# Patient Record
Sex: Female | Born: 1965 | Hispanic: No | Marital: Married | Smoking: Never smoker
Health system: Southern US, Community
[De-identification: ages and names within clinical notes are randomized; demographics above are authoritative.]

## PROBLEM LIST (undated history)

## (undated) DIAGNOSIS — D649 Anemia, unspecified: Secondary | ICD-10-CM

## (undated) DIAGNOSIS — I10 Essential (primary) hypertension: Secondary | ICD-10-CM

## (undated) DIAGNOSIS — K219 Gastro-esophageal reflux disease without esophagitis: Secondary | ICD-10-CM

## (undated) DIAGNOSIS — E785 Hyperlipidemia, unspecified: Secondary | ICD-10-CM

## (undated) HISTORY — PX: ABDOMINAL HYSTERECTOMY: SHX81

## (undated) HISTORY — DX: Hyperlipidemia, unspecified: E78.5

## (undated) HISTORY — PX: ABDOMINAL SURGERY: SHX537

## (undated) HISTORY — DX: Anemia, unspecified: D64.9

## (undated) HISTORY — DX: Gastro-esophageal reflux disease without esophagitis: K21.9

---

## 1998-01-17 ENCOUNTER — Emergency Department (HOSPITAL_COMMUNITY): Admission: EM | Admit: 1998-01-17 | Discharge: 1998-01-17 | Payer: Self-pay | Admitting: Emergency Medicine

## 1998-01-17 ENCOUNTER — Encounter: Payer: Self-pay | Admitting: Emergency Medicine

## 1999-08-24 ENCOUNTER — Ambulatory Visit (HOSPITAL_COMMUNITY): Admission: RE | Admit: 1999-08-24 | Discharge: 1999-08-24 | Payer: Self-pay | Admitting: Gynecology

## 1999-08-24 ENCOUNTER — Encounter: Payer: Self-pay | Admitting: Gynecology

## 2000-08-01 ENCOUNTER — Other Ambulatory Visit: Admission: RE | Admit: 2000-08-01 | Discharge: 2000-08-01 | Payer: Self-pay | Admitting: Gynecology

## 2000-08-01 ENCOUNTER — Encounter (INDEPENDENT_AMBULATORY_CARE_PROVIDER_SITE_OTHER): Payer: Self-pay

## 2000-10-04 ENCOUNTER — Inpatient Hospital Stay (HOSPITAL_COMMUNITY): Admission: RE | Admit: 2000-10-04 | Discharge: 2000-10-06 | Payer: Self-pay | Admitting: Gynecology

## 2000-10-04 ENCOUNTER — Encounter (INDEPENDENT_AMBULATORY_CARE_PROVIDER_SITE_OTHER): Payer: Self-pay | Admitting: *Deleted

## 2001-10-09 ENCOUNTER — Other Ambulatory Visit: Admission: RE | Admit: 2001-10-09 | Discharge: 2001-10-09 | Payer: Self-pay | Admitting: Gynecology

## 2003-03-29 ENCOUNTER — Emergency Department (HOSPITAL_COMMUNITY): Admission: EM | Admit: 2003-03-29 | Discharge: 2003-03-29 | Payer: Self-pay | Admitting: *Deleted

## 2003-10-01 ENCOUNTER — Ambulatory Visit (HOSPITAL_COMMUNITY): Admission: RE | Admit: 2003-10-01 | Discharge: 2003-10-01 | Payer: Self-pay

## 2003-12-30 ENCOUNTER — Emergency Department (HOSPITAL_COMMUNITY): Admission: EM | Admit: 2003-12-30 | Discharge: 2003-12-30 | Payer: Self-pay | Admitting: Family Medicine

## 2004-02-10 ENCOUNTER — Encounter: Admission: RE | Admit: 2004-02-10 | Discharge: 2004-05-10 | Payer: Self-pay | Admitting: Occupational Medicine

## 2004-05-06 ENCOUNTER — Ambulatory Visit (HOSPITAL_COMMUNITY): Admission: RE | Admit: 2004-05-06 | Discharge: 2004-05-06 | Payer: Self-pay | Admitting: Orthopedic Surgery

## 2004-12-07 ENCOUNTER — Other Ambulatory Visit: Admission: RE | Admit: 2004-12-07 | Discharge: 2004-12-07 | Payer: Self-pay | Admitting: Gynecology

## 2005-12-11 ENCOUNTER — Other Ambulatory Visit: Admission: RE | Admit: 2005-12-11 | Discharge: 2005-12-11 | Payer: Self-pay | Admitting: Gynecology

## 2006-06-06 ENCOUNTER — Ambulatory Visit: Payer: Self-pay | Admitting: Emergency Medicine

## 2006-06-21 ENCOUNTER — Ambulatory Visit: Payer: Self-pay | Admitting: Emergency Medicine

## 2006-07-19 ENCOUNTER — Ambulatory Visit: Payer: Self-pay | Admitting: Emergency Medicine

## 2007-02-04 DIAGNOSIS — E785 Hyperlipidemia, unspecified: Secondary | ICD-10-CM | POA: Insufficient documentation

## 2007-02-04 DIAGNOSIS — R05 Cough: Secondary | ICD-10-CM

## 2007-02-04 DIAGNOSIS — R059 Cough, unspecified: Secondary | ICD-10-CM | POA: Insufficient documentation

## 2007-02-04 DIAGNOSIS — J309 Allergic rhinitis, unspecified: Secondary | ICD-10-CM | POA: Insufficient documentation

## 2007-02-04 DIAGNOSIS — K219 Gastro-esophageal reflux disease without esophagitis: Secondary | ICD-10-CM | POA: Insufficient documentation

## 2007-02-20 ENCOUNTER — Other Ambulatory Visit: Admission: RE | Admit: 2007-02-20 | Discharge: 2007-02-20 | Payer: Self-pay | Admitting: Gynecology

## 2007-12-18 ENCOUNTER — Ambulatory Visit: Payer: Self-pay | Admitting: Emergency Medicine

## 2008-02-23 ENCOUNTER — Ambulatory Visit: Payer: Self-pay | Admitting: Gynecology

## 2008-02-23 ENCOUNTER — Other Ambulatory Visit: Admission: RE | Admit: 2008-02-23 | Discharge: 2008-02-23 | Payer: Self-pay | Admitting: Gynecology

## 2008-02-23 ENCOUNTER — Encounter: Payer: Self-pay | Admitting: Gynecology

## 2008-03-01 ENCOUNTER — Ambulatory Visit: Payer: Self-pay | Admitting: Gynecology

## 2008-05-03 ENCOUNTER — Ambulatory Visit: Payer: Self-pay | Admitting: Gynecology

## 2009-02-28 ENCOUNTER — Ambulatory Visit: Payer: Self-pay | Admitting: Gynecology

## 2009-02-28 ENCOUNTER — Other Ambulatory Visit: Admission: RE | Admit: 2009-02-28 | Discharge: 2009-02-28 | Payer: Self-pay | Admitting: Gynecology

## 2010-03-01 ENCOUNTER — Other Ambulatory Visit: Admission: RE | Admit: 2010-03-01 | Discharge: 2010-03-01 | Payer: Self-pay | Admitting: Gynecology

## 2010-03-01 ENCOUNTER — Ambulatory Visit: Payer: Self-pay | Admitting: Gynecology

## 2010-05-09 NOTE — Assessment & Plan Note (Signed)
Summary: cough   Visit Type:  Follow-up PCP:  Donette Larry   Chief Complaint:  Cough.  History of Present Illness: Ms. D'Jimraou is a very pleasant 45 year old woman, who returns today for her chronic cough. Mild restriction, no definite obstruction 06/21/06. Not on BD's. Not on standing rx for PND or GERD - uses as needed. No cough currently. Occas has paroxysms of cough with exposure to perfume.  2 weeks ago had an episode of paroxysmal cough, had to leave work. Wants to get FMLA paperwork.      Prior Medications Reviewed Using: Patient Recall  Updated Prior Medication List: CRESTOR 10 MG TABS (ROSUVASTATIN CALCIUM) Take 1 tablet by mouth once a day NASACORT AQ 55 MCG/ACT  AERS (TRIAMCINOLONE ACETONIDE(NASAL)) Take 2 sprays in each nostril once a day as needed LORATADINE 10 MG  TABS (LORATADINE) Take 1 tablet by mouth once a day as needed PROTONIX 40 MG  TBEC (PANTOPRAZOLE SODIUM) Take 1 tablet by mouth once a day as needed METFORMIN HCL 500 MG XR24H-TAB (METFORMIN HCL) Take 1 tablet by mouth once a day  Current Allergies (reviewed today): ! PENICILLIN ! SULFA ! ASA  Past Surgical History:    fibroids removed- 1994   Family History:    No significant FH  Social History:    Patient never smoked.     Pt is married, no children.    Pt works at [employer] in [employer].   Risk Factors:  Tobacco use:  never    Vital Signs:  Patient Profile:   45 Years Old Female Weight:      178.13 pounds O2 Sat:      98 % O2 treatment:    Room Air Temp:     98.2 degrees F oral Pulse rate:   68 / minute BP sitting:   130 / 84  (right arm)  Vitals Entered By: Cloyde Reams RN (December 18, 2007 10:13 AM)             Comments Pt is here today for a follow-up visit.  Requesting FMLA papers to be completed. Pt states breathing well overall, except when exposed to heavy perfumes and odors.  Occ dry cough at times, but minimal.  Medications reviewed Cloyde Reams RN  December 18, 2007  10:14 AM      Physical Exam  General:     normal appearance and healthy appearing.   Head:     normocephalic and atraumatic Nose:     no deformity, discharge, inflammation, or lesions Mouth:     no deformity or lesions Neck:     no masses, thyromegaly, or abnormal cervical nodes Lungs:     clear bilaterally to auscultation and percussion Heart:     regular rate and rhythm, S1, S2 without murmurs, rubs, gallops, or clicks Abdomen:     not examined Msk:     no deformity or scoliosis noted with normal posture Extremities:     no clubbing, cyanosis, edema, or deformity noted Neurologic:     non-focal Skin:     intact without lesions or rashes Psych:     alert and cooperative; normal mood and affect; normal attention span and concentration      Impression & Recommendations:  Problem # 1:  COUGH, CHRONIC (ICD-786.2) No definitive evidence for asthma - will need methacholine challenge to further eval. She would like to defer at this time. Will use allergy regimen and GERD regimen as ordered if cough reccurs. No FMLA paperwork at this time.  Orders: Est. Patient Level IV (16109)   Medications Added to Medication List This Visit: 1)  Crestor 10 Mg Tabs (Rosuvastatin calcium) .... Take 1 tablet by mouth once a day 2)  Nasacort Aq 55 Mcg/act Aers (Triamcinolone acetonide(nasal)) .... Take 2 sprays in each nostril once a day as needed 3)  Loratadine 10 Mg Tabs (Loratadine) .... Take 1 tablet by mouth once a day as needed 4)  Protonix 40 Mg Tbec (Pantoprazole sodium) .... Take 1 tablet by mouth once a day as needed 5)  Metformin Hcl 500 Mg Xr24h-tab (Metformin hcl) .... Take 1 tablet by mouth once a day   Patient Instructions: 1)  We will not perform any testing at this time. 2)  Please call Dr Delton Coombes if you have any problems or if you decide to have further pulmonary testing performed.   ]

## 2010-06-15 ENCOUNTER — Emergency Department (HOSPITAL_COMMUNITY): Payer: No Typology Code available for payment source

## 2010-06-15 ENCOUNTER — Emergency Department (HOSPITAL_COMMUNITY)
Admission: EM | Admit: 2010-06-15 | Discharge: 2010-06-15 | Disposition: A | Discharge status: Home / Self Care | Payer: No Typology Code available for payment source | Attending: Emergency Medicine | Admitting: Emergency Medicine

## 2010-06-15 DIAGNOSIS — J45909 Unspecified asthma, uncomplicated: Secondary | ICD-10-CM | POA: Insufficient documentation

## 2010-06-15 DIAGNOSIS — M545 Low back pain, unspecified: Secondary | ICD-10-CM | POA: Insufficient documentation

## 2010-06-15 DIAGNOSIS — E119 Type 2 diabetes mellitus without complications: Secondary | ICD-10-CM | POA: Insufficient documentation

## 2010-06-15 DIAGNOSIS — S20219A Contusion of unspecified front wall of thorax, initial encounter: Secondary | ICD-10-CM | POA: Insufficient documentation

## 2010-06-15 DIAGNOSIS — R079 Chest pain, unspecified: Secondary | ICD-10-CM | POA: Insufficient documentation

## 2010-06-15 DIAGNOSIS — S335XXA Sprain of ligaments of lumbar spine, initial encounter: Secondary | ICD-10-CM | POA: Insufficient documentation

## 2010-06-15 DIAGNOSIS — S8000XA Contusion of unspecified knee, initial encounter: Secondary | ICD-10-CM | POA: Insufficient documentation

## 2010-06-16 ENCOUNTER — Emergency Department (HOSPITAL_COMMUNITY): Payer: No Typology Code available for payment source

## 2010-06-16 ENCOUNTER — Emergency Department (HOSPITAL_COMMUNITY)
Admission: EM | Admit: 2010-06-16 | Discharge: 2010-06-16 | Disposition: A | Discharge status: Home / Self Care | Payer: No Typology Code available for payment source | Attending: Emergency Medicine | Admitting: Emergency Medicine

## 2010-06-16 DIAGNOSIS — M25519 Pain in unspecified shoulder: Secondary | ICD-10-CM | POA: Insufficient documentation

## 2010-06-16 DIAGNOSIS — R071 Chest pain on breathing: Secondary | ICD-10-CM | POA: Insufficient documentation

## 2010-06-16 DIAGNOSIS — R079 Chest pain, unspecified: Secondary | ICD-10-CM | POA: Insufficient documentation

## 2010-06-16 DIAGNOSIS — S139XXA Sprain of joints and ligaments of unspecified parts of neck, initial encounter: Secondary | ICD-10-CM | POA: Insufficient documentation

## 2010-06-16 DIAGNOSIS — Y92410 Unspecified street and highway as the place of occurrence of the external cause: Secondary | ICD-10-CM | POA: Insufficient documentation

## 2010-06-16 DIAGNOSIS — R209 Unspecified disturbances of skin sensation: Secondary | ICD-10-CM | POA: Insufficient documentation

## 2010-06-19 ENCOUNTER — Other Ambulatory Visit: Payer: Self-pay | Admitting: Internal Medicine

## 2010-06-19 ENCOUNTER — Ambulatory Visit
Admission: RE | Admit: 2010-06-19 | Discharge: 2010-06-19 | Disposition: A | Discharge status: Home / Self Care | Payer: Commercial Managed Care - PPO | Source: Ambulatory Visit | Attending: Internal Medicine | Admitting: Internal Medicine

## 2010-06-19 DIAGNOSIS — R202 Paresthesia of skin: Secondary | ICD-10-CM

## 2010-06-19 DIAGNOSIS — R4781 Slurred speech: Secondary | ICD-10-CM

## 2010-07-05 ENCOUNTER — Ambulatory Visit: Payer: 59 | Attending: Internal Medicine

## 2010-07-05 DIAGNOSIS — IMO0001 Reserved for inherently not codable concepts without codable children: Secondary | ICD-10-CM | POA: Insufficient documentation

## 2010-07-05 DIAGNOSIS — R4782 Fluency disorder in conditions classified elsewhere: Secondary | ICD-10-CM | POA: Insufficient documentation

## 2010-07-10 ENCOUNTER — Ambulatory Visit: Payer: 59 | Attending: Internal Medicine | Admitting: Speech Pathology

## 2010-07-10 DIAGNOSIS — IMO0001 Reserved for inherently not codable concepts without codable children: Secondary | ICD-10-CM | POA: Insufficient documentation

## 2010-07-10 DIAGNOSIS — R4782 Fluency disorder in conditions classified elsewhere: Secondary | ICD-10-CM | POA: Insufficient documentation

## 2010-07-13 ENCOUNTER — Ambulatory Visit: Payer: 59

## 2010-07-17 ENCOUNTER — Ambulatory Visit: Payer: 59 | Admitting: Speech Pathology

## 2010-07-19 ENCOUNTER — Ambulatory Visit: Payer: 59 | Admitting: Speech Pathology

## 2010-07-24 ENCOUNTER — Ambulatory Visit: Payer: 59

## 2010-07-24 ENCOUNTER — Encounter: Payer: 59 | Attending: Internal Medicine | Admitting: *Deleted

## 2010-07-24 DIAGNOSIS — Z713 Dietary counseling and surveillance: Secondary | ICD-10-CM | POA: Insufficient documentation

## 2010-07-24 DIAGNOSIS — R7309 Other abnormal glucose: Secondary | ICD-10-CM | POA: Insufficient documentation

## 2010-07-24 DIAGNOSIS — E7800 Pure hypercholesterolemia, unspecified: Secondary | ICD-10-CM | POA: Insufficient documentation

## 2010-07-26 ENCOUNTER — Ambulatory Visit: Payer: 59

## 2010-07-26 ENCOUNTER — Encounter: Payer: Commercial Managed Care - PPO | Admitting: Speech Pathology

## 2010-08-01 ENCOUNTER — Ambulatory Visit: Payer: 59

## 2010-08-04 ENCOUNTER — Ambulatory Visit: Payer: 59

## 2010-08-25 NOTE — H&P (Signed)
[facility]  Patient:    Alicia Gray, Alicia Gray                       MRN: [id number] Adm. Date:  10/04/00 Attending:  Gaetano Hawthorne. Lily Peer, M.D.                         History and Physical  CHIEF COMPLAINT:              Symptomatic leiomyomatous uteri.  HISTORY OF PRESENT ILLNESS:   [name] is a 45 year old, gravida 0, who was seen in the office on July 31, 2000, for her annual gynecological examination.  The patient has a longstanding history of leiomyomatous uteri. On examination, her uterus had measured 20 weeks size and very irregular and firm.  Review of her medical records had indicated that the patient had undergo abdominal myomectomy and cauterization of endometriotic implant on June 16, 1996.  She had continued to have menorrhagia as a result of the fibroids and also has had a sustained iron deficiency anemia for which her hemoglobin on the last office visit was 8.9 with a hematocrit of 26.9 and a platelet count of 282,000.  We had had a lengthy discussion resulting recently when she came to the office for endometrial biopsy to make sure that there was no hyperplasia or any malignancy inside the endometrial cavity.  The endometrial biopsy was done on July 31, 2000, which demonstrated scanty benign endometrium mixed with benign endocervical mucosa and mucus.  She had decided that she would like to proceed with an abdominal hysterectomy and she knows that she will not be able to have any children and we would conserve her ovaries.  She came to the office on Aug 08, 2000, and received Lupron 11.25 mg IM in an effort to cut down on her bleeding and also increased her iron tablet to two tablets per day to get her hemoglobin closer to normal range by the time we have her surgery.  She was scheduled for an abdominal hysterectomy on October 04, 2000.  She was seen on September 12, 2000, in the office for an ultrasound.  The ultrasound was compared with the  ultrasound that was done on April 18, 2000.  An ovarian cyst was seen in the left adnexal.  The ovarian cyst has not completely resolved.  Based on her ultrasound, she still has a uterus that measures 15 x 7 x 15 cm with multiple fibroids, essentially unchanged from previous scan.  No free fluid in the cul-de-sac was noted.  As a result of her enlarging uterus, fibroids, menorrhagia, and iron deficiency anemia, she has complaints of feeling tired and fatigued.  Since she received her Lupron, her hemoglobin has risen up to 10.8.  Of note, at the time that she had her myomectomy back in 1998, she had to receive a blood transfusion, autologous, as well as donor, due to the amount of bleeding that she had sustained.  We had removed approximately 34 fibroids at that time.  The patient is aware that she will not be able to have any children and is content with that.  ALLERGIES:                    She is allergic to PENICILLIN, ASPIRIN, and SULFA DRUGS.  PAST MEDICAL HISTORY:         Her periods last somewhere close to two weeks in duration.  Menarche was early at the age of 22.  She has had two pregnancies, two miscarriages, and two D&Cs.  FAMILY HISTORY:               Mother and aunt with diabetes.  An aunt also with hypertension.  A mentally retarded uncle.  MEDICATIONS:                  At one time she had been on Ortho-Novum 20-day oral contraceptive pill in an effort to improve her menorrhagia and was later switched to Loestrin based on her age, but has been discontinued since she was put on the Lupron.  PHYSICAL EXAMINATION:         The patient weighs 162 pounds.  HEENT:                        Unremarkable.  NECK:                         Supple.  Trachea midline.  No carotid bruits. No thyromegaly.  LUNGS:                        Clear to auscultation without rhonchi or wheezes.  HEART:                        Regular rate and rhythm.  No murmurs or gallops.  BREASTS:                       Examination done at the time of her annual exam on July 31, 2000, was unremarkable.  ABDOMEN:                      Soft, nontender.  Uterine fundus to the level of the umbilicus.  PELVIC:                       Bartholins, urethral, and Skenes glands within normal limits.  Vagina and cervix with no lesions or discharge.  Uterus 40 weeks size and irregular.  The patients most recent Pap smear was on August 02, 2000, which was normal.  EXTREMITIES:                  DTR 1+.  Negative clonus.  ASSESSMENT:                   A 45 year old, gravida 1, para 0, AB 2, with recurrent uterine fibroids.  The uterus is now 20 weeks size.  The patient suffers from menorrhagia and dysmenorrhea and at the same time has suffered from iron deficiency anemia.  She had received Lupron in an effort to raise her hemoglobin to a safe level for the time of her hysterectomy.  The patient was counseled as to the risks, benefits, pros, and cons of hysterectomy.  She had undergone endometrial biopsy which was normal and a recent Pap smear was normal as well.  She is fully aware that she will not be able to have any children.  All efforts will be made for ovarian conservation.  The patient is aware that in the event of uncontrollable hemorrhage there is an increased risk for her to have to receive a blood transfusion as a lifesaving method with its potential risks to include anaphylactic reaction, hepatitis, and AIDS.  She has had hepatitis and  HIV testing after her last transfusion.  She had received not only autologous, but also donor blood as well.  The patient will receive pneumatic compression stockings to prevent deep venous thrombosis.  She will also receive prophylactic antibiotic.  Of note, her recent mammogram was normal.  Also of note, the patient had been given the option for uterine fibroid embolization.  This was offered to her back in May of 2001.  She decided not to proceed with that  route.  The patient is also aware that in the event of any internal trauma in the abdominal cavity that corrective surgery will need to be performed if it is the bladder, nerves,  vessels, tendons, or muscles.  Also the potential risks are deep venous thrombosis, pulmonary embolization, and even death.  Also, complications may occur from anesthesia, which the anesthesia team will discuss with her at the time of preoperative consultation.  All of the above were discussed with the patient in detail.  Once again it was explained to her that once hysterectomy is completed, she will not be able to have any children.  All questions were answered.  Will following according.  PLAN:                         The patient was scheduled for total abdominal hysterectomy on Friday, October 04, 2000, at 7:30 a.m. at the [facility]. DD:  10/01/00 TD:  10/01/00 Job: [id number]

## 2010-08-25 NOTE — Op Note (Signed)
[facility]  Patient:    Alicia Gray                      MRN: [id number] Proc. Date: 10/04/00 Attending:  Gaetano Hawthorne. Lily Peer, M.D.                           Operative Report  PREOPERATIVE DIAGNOSES:       1. Leiomyomatous uteri.                               2. Menorrhagia.                               3. Dysmenorrhea.                               4. Iron-deficiency anemia.  POSTOPERATIVE DIAGNOSES:      1. Leiomyomatous uteri.                               2. Menorrhagia.                               3. Dysmenorrhea.                               4. Iron-deficiency anemia.  PROCEDURE PERFORMED:          Total abdominal hysterectomy.  SURGEON:                      Juan H. Lily Peer, M.D.  FIRST ASSISTANT:              Daniel L. Eda Paschal, M.D.  ANESTHESIA:                   General endotracheal anesthesia.  FINDINGS:                     Leiomyomatous uteri meassuring approximately 20 weeks in size with multiple intramural and subserosal leiomyomata. Approximately 20-week-sized uterus.  The patient had scattered endometriotic implants on the serosal surfaces of the uterus as well as in the area of the cul-de-sac.  Normal tubes and ovaries were otherwise noted.  No other abnormality in the pelvic cavity was noted.  INDICATIONS FOR OPERATION:    A 45 year old gravida 0 with symptomatic leiomyomata uteri consisting of dysmenorrhea, menorrhagia and iron-deficiency anemia.  The patient, in 1998, had extensive abdominal myomectomies and also had a history of endometriosis, which was cauterized.  Since then, her uterus continued to grow to approximately 20 weeks in size, contributing to the above-mentioned symptoms.  DESCRIPTION OF PROCEDURE:     After the patient was adequately counseled, she was taken to the operating room.  She underwent successful general endotracheal anesthesia.  The patient had received 1 g of Cefotan preoperatively  for prophylaxis.  Also, pneumatic compression stockings for DVT prophylaxis.  After general endotracheal anesthesia was obtained, the patients abdomen was prepped and draped in the usual sterile fashion.  A Foley catheter was inserted in an effort to monitor urinary output.  Due to the patients uterine  size and previous midline scar, it was decided to proceed with this midline approach.  A keloid that was present was excised and passed off the operative field.  The midline incision was continued from the subcutaneous tissue down to the rectus fascia, whereby a midline nick was made in the fascia and was extended in a cephalic and caudal fashion.  After the peritoneal cavity was entered, there were some adhesions that were noted from the fundus and areas of the uterus to the small bowel were meticulously lysed before placing the OConnor-OSullivan retractors.  The patient was placed in the Trendelenburg position.  The right round ligament was first identified and a transfixation stitch of 0 Vicryl suture was utilized.  The right round ligament was transected and, when clear, the broad ligament was incised to the level of the internal cervical os.  The posterior broad ligament was identified.  The surgeons finger penetrated the posterior broad ligament and a Kelly clamp was utilized to clamp the infundibulopelvic ligament, conserving the right ovary.  The infundibulopelvic ligament pedicle was tied with 0 Vicryl suture, followed by a transfixation stitch of 0 Vicryl suture.  A similar procedure was carried out on the contralateral side and, after skeletonization of the parametrial tissue down to the level of the uterine artery, the uterine arteries bilaterally were clamped, cut and suture ligated x 2 with 0 Vicryl suture and the uterus was amputated in the supracervical region to allow completion of the operation.  The uterus was passed off the operative field.  Of note, since the patient had  extensive endometriosis on the right serosal surface and near the right fallopian tube, the right fallopian tube was taken out as part of the uterus and cervix.  Both ovaries were left in place, as was the left tube.  With Kochers placed on the remaining cervical stump, meticulous dissection was required to free the remaining cervix from the parametrial tissue and the remaining cornual ligaments were clamped, cut and suture ligated with 0 Vicryl suture until the area of the lateral vaginal fornices was obtained.  Once entering the cervicovaginal region with the use of curved scissors, the cervix was separated from the vaginal fornices and passed off the operative field.  Both angles were then secured with transfixation stitches of 0 Vicryl suture.  The remaining vaginal cuff was secured with interrupted sutures of 0 Vicryl suture.  The pelvic cavity was copiously irrigated with normal saline solution.  Both ovaries were then suspended individually to each side of their respective round ligaments with 3-0 Vicryl suture.  After ascertaining adequate hemostasis and the sponge stick was removed, sponge and needle count were correct.  The patient was straightened up from the previous Trendelenburg position.  The peritoneal cavity was not reapproximated.  The rectus fascia was closed in a running stitch locking manner with 0 Prolene.  Subcutaneous bleeders were Bovie cauterized.  The skin was reapproximated with skin clips followed by placement of Xeroform gauze and a dry dressing.  The patient was extubated and transferred to the recovery room with stable vital signs. Estimated blood loss for the procedure was recorded as 500 cc.  Urine output was 300 cc and clear.  IV fluids consisted of 2500 cc of lactated Ringers. DD:  10/04/00 TD:  10/04/00 Job: [id number]

## 2010-08-25 NOTE — Assessment & Plan Note (Signed)
[facility]                             PULMONARY OFFICE NOTE   Alicia Gray                    MRN:          [id number]  DATE:07/19/2006                            DOB:          1965/10/14    SUBJECTIVE:  Alicia Gray is a very pleasant 45 year old woman, who  returns today to review our progress with her chronic cough.  At her  last visit, we added Protonix to her regimen to address any contribution  of GERD to her upper airway irritation and cough.  She has continued on  both Claritin and Nasacort to address her allergic rhinitis.  She tells  me today that her cough is much improved and that she is not  experiencing any paroxysms or associated dyspnea.  She does still have a  little bit of exertional shortness of breath with heavy exertion.  As  mentioned at her last visit, her spirometry is consistent with some mild  restriction, which is confirmed by her lung volumes, probably due to her  weight.   MEDICATIONS:  1. Simvastatin 20 mg daily.  2. Nasacort nasal sprays two sprays each nostril daily.  3. Loratadine 10 mg daily.  4. Protonix 40 mg daily.   EXAM:  In general, this is a very pleasant, overweight woman, who is in  no distress on room air.  Her weight is 190 pounds, temperature 98.5,  blood pressure 124/84, heart rate 86, spO2 98% on room air.  Her lungs  are clear to auscultation bilaterally.  Her exam is otherwise completely  unchanged from her previous exam.  She has no stridor.   IMPRESSION:  1. Chronic cough, which is being impacted by both allergic rhinitis      and GERD.  This is much improved on her current regimen.  2. No evidence for asthma, based on her pulmonary function testing,      although she probably does have some mild restriction, due to her      size.  I have asked her to continue to work on diet and exercise,      trying to lose weight, and I believe that this will help with her      exertional  dyspnea.  3. I will follow up with Alicia Gray on an as-needed basis.      Otherwise, she will follow up with her primary physician, who is      Dr. Georgann Housekeeper.     Leslye Peer, MD  Electronically Signed    RSB/MedQ  DD: 07/19/2006  DT: 07/19/2006  Job #: [id number]   cc:   Georgann Housekeeper, MD

## 2010-08-25 NOTE — Discharge Summary (Signed)
[facility]  Patient:    Alicia Gray, Alicia Gray                       MRN: [id number] Adm. Date:  [id number] Disc. Date: [id number] Attending:  Tonye Royalty Dictator:   Antony Contras, N.P.                           Discharge Summary  DISCHARGE DIAGNOSES:          Leiomyomatous uteri, menorrhagia, dysmenorrhea, iron deficiency anemia.  PROCEDURE:                    Total abdominal hysterectomy.  HISTORY OF PRESENT ILLNESS:   Patient is a 45 year old nulliparous female who has a long-standing history of leiomyomatous uteri.  She had an abdominal myomectomy and cauterization of endometrial implants in March 1998 and continued to have menorrhagia as a result of the fibroids and also iron deficiency anemia.   A recent endometrial biopsy July 31, 2000 demonstrated scanty benign endometrium mixed with benign and cervical mucosa and mucus. Patient had decided that she would like to proceed with total abdominal hysterectomy.  PAST MEDICAL HISTORY:         History of menorrhagia, two pregnancies, two SABs, and two D&Cs.  ALLERGIES:                    PENICILLIN, ASPIRIN, SULFA DRUGS.  HOSPITAL COURSE:              Patient was admitted on October 04, 2000.  Total abdominal hysterectomy was performed by Dr. Reynaldo Minium assisted by Dr. Edyth Gunnels under general anesthesia.  Findings included a leiomyomatous uterus measuring approximately 20 weeks in size with multiple intramural and subserosal leiomyoma, approximated 20 weeks size uterus.  Patient also had scattered endometriotic implants of the serosal surface of the uterus as well as the area of the cul-de-sac.  Normal tubes and ovaries were noted.  No other pelvic abnormalities.  Postoperative course patient remained afebrile.  Had no difficulty voiding.  Was able to be discharged in satisfactory condition on her second postoperative day.  LABORATORIES:                 CBC:  Hemoglobin 10.4,  hematocrit 31.7, WBC 11.9, platelets 319,000.  DISPOSITION:                  Patient is to follow-up in the office in one week for staple removal and then in two to four weeks for postoperative followup examination. DD:  10/25/00 TD:  10/26/00 Job: [id number]

## 2010-08-25 NOTE — Assessment & Plan Note (Signed)
[facility]                             PULMONARY OFFICE NOTE   Alicia Gray                    MRN:          [id number]  DATE:06/06/2006                            DOB:          Oct 04, 1965    REASON FOR CONSULTATION:  Alicia Gray is kindly referred by Dr. Georgann Housekeeper for chronic cough.   BRIEF HISTORY:  Alicia Gray is a 45 year old woman with a history of  allergic rhinitis who has been dealing with chronic cough and  questionable chronic bronchitis since 2006. She tells me that her cough  waxes and wanes, but that it is present all the time. It is usually  productive of clear to yellowish mucus and appears to be worse at night.  She does have significant nasal congestion and nasal drainage usually  worst at the time of the change of seasons. Earlier this month, she had  an episode where she lost her voice and had a sore throat. This was  accompanied by worsening of her cough and a slight change in her sputum  to a deep yellow color. She has not seen any hemoptysis. She tells me  that she occasionally wheezes at night and that the wheeze improves with  cough or with clearing her throat. She does clear her throat frequently,  although often no sputum is produced. She has not experienced any  exercise limitation and does not believe she has had any significant  shortness of breath. She has been treated with a prednisone taper and  doxycycline recently and also treated with Tussionex, which she believes  has been quite helpful in suppressing her cough.   REVIEW OF SYSTEMS:  Is significant for the findings in the HPI and also  please note she has had some chest discomfort when she has coughed  frequently that is pleuritic in nature. Sneezing, earache and ear  fullness and some changes in the color of her sputum as mentioned.   PHYSICAL EXAMINATION:  In general, this an overweight woman who is in no  distress on room air.  HEENT:  The oropharynx is clear. There is some mild posterior pharyngeal  erythema.  LUNGS:  Are clear to auscultation bilaterally.  HEART: Has a regular rate and rhythm without murmur.  ABDOMEN: Is obese, nontender. Positive bowel sounds.  EXTREMITIES: Have no cyanosis, clubbing or edema.  NEUROLOGIC: She has grossly nonfocal examination.  VITAL SIGNS: Weight is 187 pounds. Temperature is 97.9. Blood pressure  is 130/84, heart rate 112. SPO2 is 99% on room air.   IMPRESSION:  Chronic cough probably secondary to allergic rhinitis plus  a possible component of gastroesophageal reflux disease. We must also  consider possible airflow limitation.   PLAN:  1. Full pulmonary function testing with pre and post bronchodilator      assessment.  2. She will bring her recent chest x-ray for me to review at her next      appointment.  3. We will initiate a trial of loratadine plus Nasacort AQ two sprays      in each nostril daily for empiric therapy of allergic rhinitis  to      see if this improves her cough.  4. She will followup with me by next available appointment to assess      improvement on this regimen and review her lung function tests.     Leslye Peer, MD  Electronically Signed    RSB/MedQ  DD: 06/06/2006  DT: 06/06/2006  Job #: [id number]   cc:   Georgann Housekeeper, MD

## 2011-01-23 ENCOUNTER — Encounter: Payer: Self-pay | Admitting: Gynecology

## 2011-02-21 ENCOUNTER — Encounter: Payer: Self-pay | Admitting: Anesthesiology

## 2011-02-21 DIAGNOSIS — K219 Gastro-esophageal reflux disease without esophagitis: Secondary | ICD-10-CM | POA: Insufficient documentation

## 2011-02-21 DIAGNOSIS — E785 Hyperlipidemia, unspecified: Secondary | ICD-10-CM | POA: Insufficient documentation

## 2011-03-05 ENCOUNTER — Other Ambulatory Visit (HOSPITAL_COMMUNITY)
Admission: RE | Admit: 2011-03-05 | Discharge: 2011-03-05 | Disposition: A | Discharge status: Home / Self Care | Payer: 59 | Source: Ambulatory Visit | Attending: Gynecology | Admitting: Gynecology

## 2011-03-05 ENCOUNTER — Ambulatory Visit (INDEPENDENT_AMBULATORY_CARE_PROVIDER_SITE_OTHER): Payer: 59 | Admitting: Gynecology

## 2011-03-05 ENCOUNTER — Encounter: Payer: Self-pay | Admitting: Gynecology

## 2011-03-05 VITALS — BP 140/90 | Ht 61.75 in | Wt 178.0 lb

## 2011-03-05 DIAGNOSIS — R350 Frequency of micturition: Secondary | ICD-10-CM

## 2011-03-05 DIAGNOSIS — Z01419 Encounter for gynecological examination (general) (routine) without abnormal findings: Secondary | ICD-10-CM | POA: Insufficient documentation

## 2011-03-05 DIAGNOSIS — R1900 Intra-abdominal and pelvic swelling, mass and lump, unspecified site: Secondary | ICD-10-CM

## 2011-03-05 NOTE — Progress Notes (Signed)
Alicia Gray September 25, 1965 [id number]   History:    45 y.o.  for annual exam with no complaints. She did state that last week she has some frequency but no dysuria. Her urinalysis today was negative. She's been followed by Dr. Eula Listen who is monitor her hyperlipidemia and type 2 diabetes for which lab work was done recently. Patient's mammogram was October this year normal. She does her monthly self breast examination. Patient with prior history of TAH.  Past medical history,surgical history, family history and social history were all reviewed and documented in the EPIC chart.  Gynecologic History Patient's last menstrual period was 03/04/2001. Contraception: Hysterectomy Last Pap: November 2011. Results were: normal Last mammogram: October 2012. Results were: normal  Obstetric History OB History    Grav Para Term Preterm Abortions TAB SAB Ect Mult Living   3 0   3  3   0     # Outc Date GA Lbr Len/2nd Wgt Sex Del Anes PTL Lv   1 SAB            2 SAB            3 SAB                ROS:  Was performed and pertinent positives and negatives are included in the history.  Exam: chaperone present  BP 140/90  Ht 5' 1.75" (1.568 m)  Wt 178 lb (80.74 kg)  BMI 32.82 kg/m2  LMP 03/04/2001  Body mass index is 32.82 kg/(m^2).  General appearance : Well developed well nourished female. No acute distress HEENT: Neck supple, trachea midline, no carotid bruits, no thyroidmegaly Lungs: Clear to auscultation, no rhonchi or wheezes, or rib retractions  Heart: Regular rate and rhythm, no murmurs or gallops Breast:Examined in sitting and supine position were symmetrical in appearance, no palpable masses or tenderness,  no skin retraction, no nipple inversion, no nipple discharge, no skin discoloration, no axillary or supraclavicular lymphadenopathy Abdomen: no palpable masses or tenderness, no rebound or guarding Extremities: no edema or skin discoloration or tenderness  Pelvic:  Bartholin,  Urethra, Skene Glands: Within normal limits             Vagina: No gross lesions or discharge  Cervix: Absent  Uterus  absent  Adnexa  fullness in the mid cuff position masslike effect mobile. Segment of bowel? Or ovarian cyst?  Anus and perineum  normal   Rectovaginal  normal sphincter tone without palpated masses or tenderness             Hemoccult not done     Assessment/Plan:  45 y.o. female for annual exam with incidental findings consisting of a fullness in the vaginal cuff area. Questionable ovarian cyst versus segment of small bowel. Patient will be scheduled for an ultrasound later this week for further assessment. No additional blood work was done since it was done Dr. Paulita Fujita office recently. Her mammogram is up-to-date. She is taking her calcium and vitamin D twice a day for osteoporosis prevention. We discussed importance of healthy lifestyle and regular exercise to help her lose some weight. She visits the ophthalmologist once a year which has been encouraged since she has type 2 diabetes. Pap smear was done today. Mammogram is up-to-date and she was encouraged to continue monthly self breast examination. Repeat blood pressure 112/86.    [name] MD, 10:26 AM 03/05/2011

## 2011-03-06 NOTE — Progress Notes (Signed)
Addended byCammie Mcgee T on: 03/06/2011 08:56 AM   Modules accepted: Orders

## 2011-03-09 ENCOUNTER — Ambulatory Visit (INDEPENDENT_AMBULATORY_CARE_PROVIDER_SITE_OTHER): Payer: 59 | Admitting: Gynecology

## 2011-03-09 ENCOUNTER — Ambulatory Visit (INDEPENDENT_AMBULATORY_CARE_PROVIDER_SITE_OTHER): Payer: 59

## 2011-03-09 ENCOUNTER — Encounter: Payer: Self-pay | Admitting: Gynecology

## 2011-03-09 VITALS — BP 130/86

## 2011-03-09 DIAGNOSIS — R1900 Intra-abdominal and pelvic swelling, mass and lump, unspecified site: Secondary | ICD-10-CM

## 2011-03-09 DIAGNOSIS — N83202 Unspecified ovarian cyst, left side: Secondary | ICD-10-CM | POA: Insufficient documentation

## 2011-03-09 DIAGNOSIS — N83209 Unspecified ovarian cyst, unspecified side: Secondary | ICD-10-CM

## 2011-03-09 MED ORDER — MEDROXYPROGESTERONE ACETATE 150 MG/ML IM SUSP
150.0000 mg | Freq: Once | INTRAMUSCULAR | Status: AC
  Administered 2011-03-09: 150 mg via INTRAMUSCULAR

## 2011-03-09 NOTE — Progress Notes (Signed)
Patient presented to the office today for followup visit to discuss an ultrasound that was requested as a result of an incidental finding during her GYN exam on November 26. Patient with prior history of total abdominal hysterectomy. Fullness at the vaginal cuff was noted suspicious for ovarian cyst. Patient is otherwise been asymptomatic. Patient also with prior history of myomectomy to subsequently hysterectomy.  Ultrasound report: Vaginal cuff was negative. Absence of the uterus. Right ovary normal. A left ovarian echo-free thinwall avascular cyst measuring 7.7 x 5.2 x 6.9 cm no free fluid was noted.  Findings discussed with the patient options discussed with her was to proceed a laparoscopic ovarian cystectomy or to wait 2 months and administering Depo-Provera 150 mg IM to help suppress the cyst and cystoscopy persistent we'll proceed with laparoscopic cystectomy. Patient would rather wait for 2 months and rescan then then. We'll check a CA 125 level today. Patient aware of the limitations of CA 125. Recent Pap smear was normal. Literature formation laparoscopy and ovarian cyst was provided. All questions rancher will follow accordingly.

## 2011-03-09 NOTE — Progress Notes (Signed)
Addended by: Bertram Savin A on: 03/09/2011 11:25 AM   Modules accepted: Orders

## 2011-03-19 ENCOUNTER — Encounter: Payer: Self-pay | Admitting: Gynecology

## 2011-03-21 ENCOUNTER — Telehealth: Payer: Self-pay

## 2011-03-21 NOTE — Telephone Encounter (Signed)
Patient was seen Nov 30 in office and Dr. Glenetta Hew said she was to have u/s in two months and surgery to be tentatively planned for the week after.  Patient has her u/s scheduled for Jan 30th.  I called to let her know I do not have Feb schedule at this time and I will call her as soon as I do.

## 2011-04-12 ENCOUNTER — Telehealth: Payer: Self-pay

## 2011-04-12 NOTE — Telephone Encounter (Signed)
I called and left message for patient that Dr.JF had asked me to schedule her lap cystectomy for the week after her u/s.  Her u/s is Jan 30th and I scheduled her surgery for Feb 7 7:30am. I asked her to call me to discuss this.

## 2011-04-24 ENCOUNTER — Other Ambulatory Visit: Payer: Self-pay | Admitting: Gynecology

## 2011-04-24 DIAGNOSIS — N83209 Unspecified ovarian cyst, unspecified side: Secondary | ICD-10-CM

## 2011-05-03 ENCOUNTER — Other Ambulatory Visit: Payer: Self-pay

## 2011-05-03 DIAGNOSIS — N83209 Unspecified ovarian cyst, unspecified side: Secondary | ICD-10-CM

## 2011-05-03 NOTE — Progress Notes (Signed)
Decided not to add order at this time.

## 2011-05-09 ENCOUNTER — Ambulatory Visit (INDEPENDENT_AMBULATORY_CARE_PROVIDER_SITE_OTHER): Payer: 59 | Admitting: Gynecology

## 2011-05-09 ENCOUNTER — Ambulatory Visit (INDEPENDENT_AMBULATORY_CARE_PROVIDER_SITE_OTHER): Payer: 59

## 2011-05-09 ENCOUNTER — Ambulatory Visit: Payer: 59

## 2011-05-09 ENCOUNTER — Encounter: Payer: Self-pay | Admitting: Gynecology

## 2011-05-09 VITALS — BP 136/78

## 2011-05-09 DIAGNOSIS — N83209 Unspecified ovarian cyst, unspecified side: Secondary | ICD-10-CM

## 2011-05-09 NOTE — Progress Notes (Signed)
Patient today for followup ultrasound. Patient had been seen in the office in November for her annual exam and an incidental finding during physical examination was noted at the vaginal cuff were fullness with had been noted. Patient with prior history of colon, hysterectomy in the past. The ultrasound on November 30 demonstrated the following:   Vaginal cuff was negative. Absence of the uterus. Right ovary normal. A left ovarian echo-free thinwall avascular cyst measuring 7.7 x 5.2 x 6.9 cm no free fluid was noted.   Patient had a normal CA 125 and received Depo-Provera 150 mg IM. She was tentatively scheduled to undergo laparoscopic cystectomy on February 7 depending on today's results on ultrasound. Today's ultrasound report as follows:  Absent uterus. Right ovary normal. Left ovary normal. Thinwall echo-free triangle fluid space equal 14 mm, a second tubular-shaped cystic area = 16 x 3 mm. Both avascular. Suspect mild hydrosalpinx. Previous cystic mass not seen. Minimal fluid seen in cul-de-sac.  Patient is asymptomatic. Recent Pap smear November 2012 normal. Patient returned back to the office at the end of the year for her annual exam or when necessary.

## 2011-05-09 NOTE — Patient Instructions (Signed)
Good news. Your previous ovarian cyst has resolved completely. We'll no longer need to proceed with planned laparoscopic surgery to remove it. We'll see her in November for your annual gynecological examination.

## 2011-05-17 ENCOUNTER — Encounter (HOSPITAL_BASED_OUTPATIENT_CLINIC_OR_DEPARTMENT_OTHER): Admission: RE | Payer: Self-pay | Source: Ambulatory Visit

## 2011-05-17 ENCOUNTER — Ambulatory Visit (HOSPITAL_BASED_OUTPATIENT_CLINIC_OR_DEPARTMENT_OTHER): Admission: RE | Admit: 2011-05-17 | Payer: 59 | Source: Ambulatory Visit | Admitting: Gynecology

## 2011-05-17 SURGERY — EXCISION, CYST, OVARY
Anesthesia: General | Laterality: Left

## 2012-01-18 ENCOUNTER — Ambulatory Visit (INDEPENDENT_AMBULATORY_CARE_PROVIDER_SITE_OTHER): Payer: Self-pay | Admitting: Family Medicine

## 2012-01-18 DIAGNOSIS — E119 Type 2 diabetes mellitus without complications: Secondary | ICD-10-CM

## 2012-01-21 NOTE — Progress Notes (Signed)
Patient presents today for 3 month DM follow-up as part of the employer-sponsored [employer] to [employer] program. Medications and glucose readings have been reviewed. I have also discussed with patient lifestyle interventions such as healthy eating choices and physical activity. Details of this visit can be found in [employer] documenting program available through [facility] ([facility]). Patient has set a series of personal goals and will follow-up in 3 months for further review of DM.  

## 2012-01-24 ENCOUNTER — Encounter: Payer: Self-pay | Admitting: Gynecology

## 2012-01-31 ENCOUNTER — Encounter: Payer: Self-pay | Admitting: Family Medicine

## 2012-01-31 NOTE — Progress Notes (Signed)
Patient ID: Alicia Gray, female   DOB: [birth year], 46 y.o.   MRN: [id number] Reviewed and agree with this documentation and management.

## 2012-03-04 ENCOUNTER — Encounter (HOSPITAL_COMMUNITY): Payer: Self-pay | Admitting: *Deleted

## 2012-03-04 ENCOUNTER — Emergency Department (HOSPITAL_COMMUNITY)
Admission: EM | Admit: 2012-03-04 | Discharge: 2012-03-04 | Disposition: A | Discharge status: Home / Self Care | Payer: No Typology Code available for payment source | Attending: Emergency Medicine | Admitting: Emergency Medicine

## 2012-03-04 ENCOUNTER — Emergency Department (HOSPITAL_COMMUNITY): Payer: No Typology Code available for payment source

## 2012-03-04 DIAGNOSIS — S46909A Unspecified injury of unspecified muscle, fascia and tendon at shoulder and upper arm level, unspecified arm, initial encounter: Secondary | ICD-10-CM | POA: Insufficient documentation

## 2012-03-04 DIAGNOSIS — Y9389 Activity, other specified: Secondary | ICD-10-CM | POA: Insufficient documentation

## 2012-03-04 DIAGNOSIS — E119 Type 2 diabetes mellitus without complications: Secondary | ICD-10-CM | POA: Insufficient documentation

## 2012-03-04 DIAGNOSIS — S4980XA Other specified injuries of shoulder and upper arm, unspecified arm, initial encounter: Secondary | ICD-10-CM | POA: Insufficient documentation

## 2012-03-04 DIAGNOSIS — Z79899 Other long term (current) drug therapy: Secondary | ICD-10-CM | POA: Insufficient documentation

## 2012-03-04 DIAGNOSIS — K219 Gastro-esophageal reflux disease without esophagitis: Secondary | ICD-10-CM | POA: Insufficient documentation

## 2012-03-04 DIAGNOSIS — Z862 Personal history of diseases of the blood and blood-forming organs and certain disorders involving the immune mechanism: Secondary | ICD-10-CM | POA: Insufficient documentation

## 2012-03-04 DIAGNOSIS — Y92410 Unspecified street and highway as the place of occurrence of the external cause: Secondary | ICD-10-CM | POA: Insufficient documentation

## 2012-03-04 DIAGNOSIS — T07XXXA Unspecified multiple injuries, initial encounter: Secondary | ICD-10-CM

## 2012-03-04 DIAGNOSIS — E785 Hyperlipidemia, unspecified: Secondary | ICD-10-CM | POA: Insufficient documentation

## 2012-03-04 NOTE — ED Notes (Signed)
Patient ambulated to X-ray 
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
## 2012-03-05 ENCOUNTER — Ambulatory Visit (INDEPENDENT_AMBULATORY_CARE_PROVIDER_SITE_OTHER): Payer: 59 | Admitting: Gynecology

## 2012-03-05 ENCOUNTER — Encounter: Payer: Self-pay | Admitting: Gynecology

## 2012-03-05 VITALS — BP 140/90 | Ht 61.5 in | Wt 179.0 lb

## 2012-03-05 DIAGNOSIS — N839 Noninflammatory disorder of ovary, fallopian tube and broad ligament, unspecified: Secondary | ICD-10-CM | POA: Insufficient documentation

## 2012-03-05 DIAGNOSIS — R635 Abnormal weight gain: Secondary | ICD-10-CM

## 2012-03-05 DIAGNOSIS — Z01419 Encounter for gynecological examination (general) (routine) without abnormal findings: Secondary | ICD-10-CM

## 2012-03-05 DIAGNOSIS — E119 Type 2 diabetes mellitus without complications: Secondary | ICD-10-CM

## 2012-03-05 NOTE — Patient Instructions (Signed)
Exercise to Lose Weight Exercise and a healthy diet may help you lose weight. Your doctor may suggest specific exercises. EXERCISE IDEAS AND TIPS  Choose low-cost things you enjoy doing, such as walking, bicycling, or exercising to workout videos.   Take stairs instead of the elevator.   Walk during your lunch break.   Park your car further away from work or school.   Go to a gym or an exercise class.   Start with 5 to 10 minutes of exercise each day. Build up to 30 minutes of exercise 4 to 6 days a week.   Wear shoes with good support and comfortable clothes.   Stretch before and after working out.   Work out until you breathe harder and your heart beats faster.   Drink extra water when you exercise.   Do not do so much that you hurt yourself, feel dizzy, or get very short of breath.  Exercises that burn about 150 calories:  Running 1  miles in 15 minutes.   Playing volleyball for 45 to 60 minutes.   Washing and waxing a car for 45 to 60 minutes.   Playing touch football for 45 minutes.   Walking 1  miles in 35 minutes.   Pushing a stroller 1  miles in 30 minutes.   Playing basketball for 30 minutes.   Raking leaves for 30 minutes.   Bicycling 5 miles in 30 minutes.   Walking 2 miles in 30 minutes.   Dancing for 30 minutes.   Shoveling snow for 15 minutes.   Swimming laps for 20 minutes.   Walking up stairs for 15 minutes.   Bicycling 4 miles in 15 minutes.   Gardening for 30 to 45 minutes.   Jumping rope for 15 minutes.   Washing windows or floors for 45 to 60 minutes.  Document Released: 04/28/2010 Document Revised: 12/06/2010 Document Reviewed: 04/28/2010 [facility] Patient Information 2012 [place], [place].                         Patient information: High cholesterol (The Basics)  What is cholesterol? - Cholesterol is a substance that is found in the blood. Everyone has some. It is needed for good health. The problem is, people sometimes have too  much cholesterol. Compared with people with normal cholesterol, people with high cholesterol have a higher risk of heart attacks, strokes, and other health problems. The higher your cholesterol, the higher your risk of these problems.  Are there different types of cholesterol? - Yes, there are a few different types. If you get a cholesterol test, you may hear your doctor or nurse talk about: Total cholesterol  LDL cholesterol - Some people call this the "bad" cholesterol. That's because having high LDL levels raises your risk of heart attacks, strokes, and other health problems.  HDL cholesterol - Some people call this the "good" cholesterol. That's because having high HDL levels lowers your risk of heart attacks, strokes, and other health problems.  Non-HDL cholesterol - Non-HDL cholesterol is your total cholesterol minus your HDL cholesterol.  Triglycerides - Triglycerides are not cholesterol. They are a type of fat. But they often get measured when cholesterol is measured. (Having high triglycerides also seems to increase the risk of heart attacks and strokes.)  What should my numbers be? - Ask your doctor or nurse what your numbers should be. Different people need different goals. (If you live outside the [place], see (table 1)). In general,  people who do not already have heart disease should aim for: Total cholesterol below 200  LDL cholesterol below 130 - or much lower, if they are at risk of heart attacks or strokes  HDL cholesterol above 60  Non-HDL cholesterol below 160 - or lower, if they are at risk of heart attacks or strokes  Triglycerides below 150 Keep in mind, though, that many people who cannot meet these goals still have a low risk of heart attacks and strokes. What should I do if my doctor tells me I have high cholesterol? - Ask your doctor what your overall risk of heart attacks and strokes is. High cholesterol, by itself, is not always a reason to worry. Having high  cholesterol is just one of many things that can increase your risk of heart attacks and strokes. Other factors that increase your risk include:  Cigarette smoking  High blood pressure  Having a parent, sister, or brother who got heart disease at a young age (Young, in this case, means younger than 83 for men and younger than 100 for women.)  Being a man (Women are at risk, too, but men have a higher risk.)  Older age  If you are at high risk of heart attacks and strokes, having high cholesterol is a problem. On the other hand, if you have are at low risk, having high cholesterol may not mean much. Should I take medicine to lower cholesterol? - Not everyone who has high cholesterol needs medicines. Your doctor or nurse will decide if you need them based on your age, family history, and other health concerns.  You should probably take a cholesterol-lowering medicine called a statin if you: Already had a heart attack or stroke  Have known heart disease  Have diabetes  Have a condition called peripheral artery disease, which makes it painful to walk, and happens when the arteries in your legs get clogged with fatty deposits  Have an abdominal aortic aneurysm, which is a widening of the main artery in the belly  Most people with any of the conditions listed above should take a statin no matter what their cholesterol level is. If your doctor or nurse puts you on a statin, stay on it. The medicine may not make you feel any different. But it can help prevent heart attacks, strokes, and death.  Can I lower my cholesterol without medicines? - Yes, you can lower your cholesterol some by:  Avoiding red meat, butter, fried foods, cheese, and other foods that have a lot of saturated fat  Losing weight (if you are overweight)  Being more active Even if these steps do little to change your cholesterol, they can improve your health in many ways.   [facility] HMD8:45 AMTD@                                                     Cholesterol Control Diet  Cholesterol levels in your body are determined significantly by your diet. Cholesterol levels may also be related to heart disease. The following material helps to explain this relationship and discusses what you can do to help keep your heart healthy. Not all cholesterol is bad. Low-density lipoprotein (LDL) cholesterol is the "bad" cholesterol. It may cause fatty deposits to build up inside your arteries. High-density lipoprotein (HDL) cholesterol is "good." It helps to remove  the "bad" LDL cholesterol from your blood. Cholesterol is a very important risk factor for heart disease. Other risk factors are high blood pressure, smoking, stress, heredity, and weight. The heart muscle gets its supply of blood through the coronary arteries. If your LDL cholesterol is high and your HDL cholesterol is low, you are at risk for having fatty deposits build up in your coronary arteries. This leaves less room through which blood can flow. Without sufficient blood and oxygen, the heart muscle cannot function properly and you may feel chest pains (angina pectoris). When a coronary artery closes up entirely, a part of the heart muscle may die, causing a heart attack (myocardial infarction). CHECKING CHOLESTEROL When your caregiver sends your blood to a lab to be analyzed for cholesterol, a complete lipid (fat) profile may be done. With this test, the total amount of cholesterol and levels of LDL and HDL are determined. Triglycerides are a type of fat that circulates in the blood and can also be used to determine heart disease risk. The list below describes what the numbers should be: Test: Total Cholesterol.  Less than 200 mg/dl.  Test: LDL "bad cholesterol."  Less than 100 mg/dl.   Less than 70 mg/dl if you are at very high risk of a heart attack or sudden cardiac death.  Test: HDL "good cholesterol."  Greater than 50 mg/dl for women.   Greater than 40 mg/dl for men.    Test: Triglycerides.  Less than 150 mg/dl.  CONTROLLING CHOLESTEROL WITH DIET Although exercise and lifestyle factors are important, your diet is key. That is because certain foods are known to raise cholesterol and others to lower it. The goal is to balance foods for their effect on cholesterol and more importantly, to replace saturated and trans fat with other types of fat, such as monounsaturated fat, polyunsaturated fat, and omega-3 fatty acids. On average, a person should consume no more than 15 to 17 g of saturated fat daily. Saturated and trans fats are considered "bad" fats, and they will raise LDL cholesterol. Saturated fats are primarily found in animal products such as meats, butter, and cream. However, that does not mean you need to sacrifice all your favorite foods. Today, there are good tasting, low-fat, low-cholesterol substitutes for most of the things you like to eat. Choose low-fat or nonfat alternatives. Choose round or loin cuts of red meat, since these types of cuts are lowest in fat and cholesterol. Chicken (without the skin), fish, veal, and ground [place] breast are excellent choices. Eliminate fatty meats, such as hot dogs and salami. Even shellfish have little or no saturated fat. Have a 3 oz (85 g) portion when you eat lean meat, poultry, or fish. Trans fats are also called "partially hydrogenated oils." They are oils that have been scientifically manipulated so that they are solid at room temperature resulting in a longer shelf life and improved taste and texture of foods in which they are added. Trans fats are found in stick margarine, some tub margarines, cookies, crackers, and baked goods.  When baking and cooking, oils are an excellent substitute for butter. The monounsaturated oils are especially beneficial since it is believed they lower LDL and raise HDL. The oils you should avoid entirely are saturated tropical oils, such as coconut and palm.  Remember to eat liberally  from food groups that are naturally free of saturated and trans fat, including fish, fruit, vegetables, beans, grains (barley, rice, couscous, bulgur wheat), and pasta (without cream sauces).  IDENTIFYING FOODS THAT LOWER CHOLESTEROL  Soluble fiber may lower your cholesterol. This type of fiber is found in fruits such as apples, vegetables such as broccoli, potatoes, and carrots, legumes such as beans, peas, and lentils, and grains such as barley. Foods fortified with plant sterols (phytosterol) may also lower cholesterol. You should eat at least 2 g per day of these foods for a cholesterol lowering effect.  Read package labels to identify low-saturated fats, trans fats free, and low-fat foods at the supermarket. Select cheeses that have only 2 to 3 g saturated fat per ounce. Use a heart-healthy tub margarine that is free of trans fats or partially hydrogenated oil. When buying baked goods (cookies, crackers), avoid partially hydrogenated oils. Breads and muffins should be made from whole grains (whole-wheat or whole oat flour, instead of "flour" or "enriched flour"). Buy non-creamy canned soups with reduced salt and no added fats.  FOOD PREPARATION TECHNIQUES  Never deep-fry. If you must fry, either stir-fry, which uses very little fat, or use non-stick cooking sprays. When possible, broil, bake, or roast meats, and steam vegetables. Instead of dressing vegetables with butter or margarine, use lemon and herbs, applesauce and cinnamon (for squash and sweet potatoes), nonfat yogurt, salsa, and low-fat dressings for salads.  LOW-SATURATED FAT / LOW-FAT FOOD SUBSTITUTES Meats / Saturated Fat (g)  Avoid: Steak, marbled (3 oz/85 g) / 11 g   Choose: Steak, lean (3 oz/85 g) / 4 g   Avoid: Hamburger (3 oz/85 g) / 7 g   Choose: Hamburger, lean (3 oz/85 g) / 5 g   Avoid: Ham (3 oz/85 g) / 6 g   Choose: Ham, lean cut (3 oz/85 g) / 2.4 g   Avoid: Chicken, with skin, dark meat (3 oz/85 g) / 4 g   Choose:  Chicken, skin removed, dark meat (3 oz/85 g) / 2 g   Avoid: Chicken, with skin, light meat (3 oz/85 g) / 2.5 g   Choose: Chicken, skin removed, light meat (3 oz/85 g) / 1 g  Dairy / Saturated Fat (g)  Avoid: Whole milk (1 cup) / 5 g   Choose: Low-fat milk, 2% (1 cup) / 3 g   Choose: Low-fat milk, 1% (1 cup) / 1.5 g   Choose: Skim milk (1 cup) / 0.3 g   Avoid: Hard cheese (1 oz/28 g) / 6 g   Choose: Skim milk cheese (1 oz/28 g) / 2 to 3 g   Avoid: Cottage cheese, 4% fat (1 cup) / 6.5 g   Choose: Low-fat cottage cheese, 1% fat (1 cup) / 1.5 g   Avoid: Ice cream (1 cup) / 9 g   Choose: Sherbet (1 cup) / 2.5 g   Choose: Nonfat frozen yogurt (1 cup) / 0.3 g   Choose: Frozen fruit bar / trace   Avoid: Whipped cream (1 tbs) / 3.5 g   Choose: Nondairy whipped topping (1 tbs) / 1 g  Condiments / Saturated Fat (g)  Avoid: Mayonnaise (1 tbs) / 2 g   Choose: Low-fat mayonnaise (1 tbs) / 1 g   Avoid: Butter (1 tbs) / 7 g   Choose: Extra light margarine (1 tbs) / 1 g   Avoid: Coconut oil (1 tbs) / 11.8 g   Choose: Olive oil (1 tbs) / 1.8 g   Choose: Corn oil (1 tbs) / 1.7 g   Choose: Safflower oil (1 tbs) / 1.2 g   Choose: Sunflower oil (1 tbs) / 1.4 g  Choose: Soybean oil (1 tbs) / 2.4 g   Choose: Canola oil (1 tbs) / 1 g  Document Released: 03/26/2005 Document Revised: 12/06/2010 Document Reviewed: 09/14/2010 [facility] Patient Information 2012 [place], [place].

## 2012-03-05 NOTE — Progress Notes (Signed)
Alicia Gray [birth year] [id number]   History:    46 y.o.  for annual gyn exam with no complaints today. Patient has history of type 2 diabetes as well as hypercholesterolemia and is being followed by her primary physician Dr. Eula Listen for which she has appointment with labs to be drawn next week. 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 Her mammogram was October this year which was normal and she frequently does her self breast examination. Her vaccinations are up-to-date.  Past medical history,surgical history, family history and social history were all reviewed and documented in the EPIC chart.  Gynecologic History Patient's last menstrual period was 03/04/2001. Contraception: status post hysterectomy Last Pap: 2012. Results were: normal Last mammogram: 2013. Results were: normal  Obstetric History OB History    Grav Para Term Preterm Abortions TAB SAB Ect Mult Living   3 0   3  3   0     # Outc Date GA Lbr Len/2nd Wgt Sex Del Anes PTL Lv   1 SAB            2 SAB            3 SAB                ROS: A ROS was performed and pertinent positives and negatives are included in the history.  GENERAL: No fevers or chills. HEENT: No change in vision, no earache, sore throat or sinus congestion. NECK: No pain or stiffness. CARDIOVASCULAR: No chest pain or pressure. No palpitations. PULMONARY: No shortness of breath, cough or wheeze. GASTROINTESTINAL: No abdominal pain, nausea, vomiting or diarrhea, melena or bright red blood per rectum. GENITOURINARY: No urinary frequency, urgency, hesitancy or dysuria. MUSCULOSKELETAL: No joint or muscle pain, no back pain, no recent trauma. DERMATOLOGIC: No rash, no itching, no lesions. ENDOCRINE: No polyuria, polydipsia, no heat or cold intolerance. No recent change in weight. HEMATOLOGICAL: No anemia or easy bruising or bleeding. NEUROLOGIC: No headache,  seizures, numbness, tingling or weakness. PSYCHIATRIC: No depression, no loss of interest in normal activity or change in sleep pattern.     Exam: chaperone present  BP 140/90  Ht 5' 1.5" (1.562 m)  Wt 179 lb (81.194 kg)  BMI 33.27 kg/m2  LMP 03/04/2001  Body mass index is 33.27 kg/(m^2).  General appearance : Well developed well nourished female. No acute distress HEENT: Neck supple, trachea midline, no carotid bruits, no thyroidmegaly Lungs: Clear to auscultation, no rhonchi or wheezes, or rib retractions  Heart: Regular rate and rhythm, no murmurs or gallops Breast:Examined in sitting and supine position were symmetrical in appearance, no palpable masses or tenderness,  no skin retraction, no nipple inversion, no nipple discharge, no skin discoloration, no axillary or supraclavicular lymphadenopathy Abdomen: no palpable masses or tenderness, no rebound or guarding Extremities: no edema or skin discoloration or tenderness  Pelvic:  Bartholin, Urethra, Skene Glands: Within normal limits             Vagina: No gross lesions or discharge  Cervix: Absent  Uterus absent  Adnexa  Without masses or tenderness  Anus and perineum  normal   Rectovaginal  normal sphincter tone without palpated masses or tenderness             Hemoccult not done     Assessment/Plan:  46 y.o. 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 The ultrasound from January 2013 had demonstrated a left and right ovary which were normal but a thinwall echo-free tremor fluid area was noted and measured 14 mm and a second tubular-shaped cystic area on the left measuring 16 x 3 mm was noted both were avascular. Previous cystic masses that were seen several months prior were not seen on that scan. Patient will return back in January for followup ultrasound. No Pap smear done today the new guidelines were discussed. Literature information on cholesterol  lowering diet and exercise were provided. She was encouraged to continue to do her monthly self breast examination. Also we discussed importance of calcium and vitamin D as well. Labs will be drawn next week by her primary physician Dr. Eula Listen.    [name] MD, 9:08 AM 03/05/2012

## 2012-04-16 ENCOUNTER — Encounter: Payer: Self-pay | Admitting: Gynecology

## 2012-04-16 ENCOUNTER — Ambulatory Visit (INDEPENDENT_AMBULATORY_CARE_PROVIDER_SITE_OTHER): Payer: 59 | Admitting: Gynecology

## 2012-04-16 ENCOUNTER — Ambulatory Visit (INDEPENDENT_AMBULATORY_CARE_PROVIDER_SITE_OTHER): Payer: 59

## 2012-04-16 VITALS — BP 140/88

## 2012-04-16 DIAGNOSIS — N7011 Chronic salpingitis: Secondary | ICD-10-CM

## 2012-04-16 DIAGNOSIS — N8300 Follicular cyst of ovary, unspecified side: Secondary | ICD-10-CM

## 2012-04-16 DIAGNOSIS — N839 Noninflammatory disorder of ovary, fallopian tube and broad ligament, unspecified: Secondary | ICD-10-CM

## 2012-04-16 DIAGNOSIS — N7013 Chronic salpingitis and oophoritis: Secondary | ICD-10-CM

## 2012-04-16 NOTE — Progress Notes (Signed)
Patient is a 47 year old who presented to the office today for followup ultrasound. Patient was seen the office for her annual exam 03/05/2012 see previous note for details. 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 The ultrasound from January 2013 had demonstrated a left and right ovary which were normal but a thinwall echo-free  fluid area was noted and measured 14 mm and a second tubular-shaped cystic area on the left measuring 16 x 3 mm was noted both were avascular. Previous cystic masses that were seen several months prior were not seen on that scan.  Patient is asymptomatic. Ultrasound today: Absent uterus, right ovary normal. Left ovary echo-free follicle 21 x 17 x 21 mm. Continued presence of a tubular structure inferior to the left ovary measuring 16 x 7 x 15 mm which appears to be a small hydrosalpinx and has gotten smaller.  Patient was reassured. Patient will return in one year for her annual exam or when necessary. She's in the process of obtaining Dr. Donette Larry.

## 2012-05-24 ENCOUNTER — Other Ambulatory Visit: Payer: Self-pay

## 2012-08-11 ENCOUNTER — Ambulatory Visit (INDEPENDENT_AMBULATORY_CARE_PROVIDER_SITE_OTHER): Payer: Self-pay | Admitting: Family Medicine

## 2012-08-11 VITALS — BP 146/87 | HR 75 | Ht 62.0 in | Wt 174.0 lb

## 2012-08-11 DIAGNOSIS — E119 Type 2 diabetes mellitus without complications: Secondary | ICD-10-CM

## 2012-08-11 NOTE — Progress Notes (Signed)
Established Patient - Follow-Up Evaluation  [facility] Consult - Clinical Pharmacist  Care Team Member: Gerre Pebbles      Subjective:  Patient presents today for 3 month diabetes follow-up as part of the employer-sponsored [employer] program. Current diabetes regimen include metformin 500 mg once daily. Patient also continues on daily statin.   Patient has lost 4 pounds since her last appointment with me. She attributes this to increased physical activity. She is doing Zumba 3-4 days a week for 60 minutes, elliptical/treadmill/bike rotation once a week for 60-90 minutes. She is doing Yoga once a week on Tuesdays for an hour. She has noticed a difference in the way her clothes fit. She states that she was finally motivated to start exercising because she wants to lose weight and get off her medication. She states that she has been exercising since the beginning of February.   She also has a new position with [employer]. She is the schedule puller for the cardiology/nuclear area. Her work hours are still the same- 8-5 M-F.   Medication changes- switched from Nexium to Pantoprazole and from Crestor to atorvastatin.    Disease Assessments:  Diabetes:  Type of Diabetes: Diabetes; Sees Diabetes provider 2 times per year; MD managing Diabetes Hussein; checks feet daily; hypoglycemia frequency rarely; takes medications as prescribed; does not take an aspirin a day;  checks blood glucose 2-6 times a week; uses glucometer; Highest CBG 98; Lowest CBG 91;   Other Diabetes History: She is checking her blood sugar three times a week, first thing in the morning, fasting levels.   She did not bring her meter with her today. She reports that on average it is running 97-98. Highest- 98 and lowest was 91. No hypoglycemia.     Social History:  Caffeine use: 0 servings per day; Denies alcohol use; Diet adherence 50-75% of the time; Medication adherence adherent; Patient can afford medications; Patient knows the  purpose/use of medications; Exercise adherence 3-4 days a week; 300 minutes of exercise per week.  Goes to Zumba class and uses the eliptical, stationary bike, and treadmill  Nutrition-  Feels like overall she is doing pretty good. She states that she has gone a whole month without dessert. She has gotten away from snacking on sweet things. She states she isn't snacking much lately. If she is, she is snacking on corn nuts, craisins, low sodium pretzels and yogurt covered pretzels.  Testing:  Blood Sugar Tests:  Hemoglobin A1c: 6.1     Assessment/Plan: Patient is a 47 year female with diabetes. A1C today was 6.1% and is meeting goal of less than 6.5%. Patient has made significant improvement and progress since her last appointment with me. She has lost 4 pounds and is consistently exercising. She seems happier and more radiant than she was at her previous appointment.   I congratulated patient on her success and encouraged her to continue to make healthy eating choices and to continue physical activity. I will fax her A1C results to Dr. Eula Listen.   Follow up with patient in 3 months..    Goals for Next Visit-  Congrats on your weight loss!!!  1. 10 pound weight loss by next visit.  2. Continue physical activity- zumba, yoga, cardio exercise.  3. For snacks- look for fruits and vegetables. Try one new fruit or vegetable.  Next appointment is Friday August 8th at 4 PM.   Aundra Millet D. Vivia Ewing, PharmD, BCPS

## 2012-08-21 NOTE — Progress Notes (Signed)
Patient ID: Alicia Gray, female   DOB: 1966/02/12, 47 y.o.   MRN: [id number] She appears well, in no apparent distress.  Alert and oriented times three, pleasant and cooperative. Vital signs are as documented in vital signs section.

## 2012-11-28 ENCOUNTER — Ambulatory Visit (INDEPENDENT_AMBULATORY_CARE_PROVIDER_SITE_OTHER): Payer: Self-pay | Admitting: Family Medicine

## 2012-11-28 VITALS — BP 123/78 | HR 73 | Ht 62.5 in | Wt 175.6 lb

## 2012-11-28 DIAGNOSIS — E119 Type 2 diabetes mellitus without complications: Secondary | ICD-10-CM

## 2012-11-28 NOTE — Progress Notes (Signed)
Subjective:  Patient presents today for 3 month diabetes follow-up as part of the employer-sponsored [employer] to [employer] program. Current diabetes regimen includes metformin 500 mg once daily. Patient also continues on daily ACEi, and statin. Most recent MD follow-up was in June with Dr. Eula Listen. No med changes or major health changes at this time.    Last appointment with Dr. Eula Listen was June 30th. Her A1C was 6.1%. No medication changes. Complains of fishy burps. Discussed putting fish oil capsules in the freezer. Also offered to get a prescription for Lovaza but patient declined at this point.    Disease Assessments:  Diabetes:  Sees Diabetes provider 2 times per year; MD managing Diabetes Hussein; checks feet daily; hypoglycemia frequency rarely; takes medications as prescribed; does not take an aspirin a day; uses glucometer; Type of Diabetes: Type 2;   7 day CBG average ---; 14 day CBG average ---; 30 day CBG average 94; Highest CBG 131; Lowest CBG 86;   Other Diabetes History: She reports that she was checking her blood sugar every day in the morning. At her most recent appointment with Dr. Eula Listen he told her that she didn't have to check every day, she only had to check once a week. She only has 2 CBG measurements in the last month on her meter.      Physical Activity-  Zumba- Monday, Tuesday and some Saturdays. The class lasts for an hour. For 30 minutes after the class she does jumping jacks, sit ups- mini boot camp.   She has also been doing yard work and some walking.    Nutrition-  She has increased fruit intake since her last appointment with me. She is snacking on trail mix energy blast. She reports no other changes. Snacking has been cut back since last time. She has cut ","waaay back"," on her desserts.  Dilated Eye Exam: 04/10/2012  Flu vaccine: 01/08/2012  Foot Exam: 04/08/2012  Other Preventive Care Notes: Last dental visit July 2014    Vital Signs:  11/28/2012 4:21  PM (EST) BMI 32.1; Height 5 ft 2 in; Weight 175.6 lbs    Testing:  Blood Sugar Tests:  Hemoglobin A1c: 6.1 resulted on 10/06/2012       Assessment/Plan: Patient is a 47 year old female with DM2. Most recent A1C was 6.1% and is meeting goal of less than 7%. Glycemic control is achieved with metformin 500 mg once daily, diet and physical activity.   Weight has increased 2 pounds since her last appointment with me. Patient stated that she was still interested in losing weight. We disccused weight loss in terms of a calorie balance. We also reviewed basic carbohydrate counting. Patient reports that she is still exercising 2-3 days a week.  I encouraged her to increase physical activity by walking an additional 2 nights during the week.   Follow up with patient in 3 months..     Goals for Next Visit-  1. Lose weight. Work to get 3-4 servings of carbohydrates with each meal.  2. Continue Zumba. Try walking on Thursday and Friday to increase physical activity.   Next appointment is Friday November 21st at 4 PM.

## 2013-01-23 ENCOUNTER — Encounter: Payer: Self-pay | Admitting: Gynecology

## 2013-02-12 ENCOUNTER — Other Ambulatory Visit: Payer: Self-pay

## 2013-03-02 ENCOUNTER — Ambulatory Visit (INDEPENDENT_AMBULATORY_CARE_PROVIDER_SITE_OTHER): Payer: Self-pay | Admitting: Family Medicine

## 2013-03-02 VITALS — BP 140/87 | HR 78 | Ht 62.0 in | Wt 177.2 lb

## 2013-03-02 DIAGNOSIS — R7303 Prediabetes: Secondary | ICD-10-CM

## 2013-03-02 DIAGNOSIS — R7309 Other abnormal glucose: Secondary | ICD-10-CM

## 2013-03-02 NOTE — Progress Notes (Signed)
Subjective:  Patient presents today for 3 month pre-diabetes follow-up as part of the employer-sponsored [employer] to [employer] program. Current diabetes regimen includes metformin 500 mg once daily. Patient also continues on daily statin.   Patient states that things are unchanged since her last appointment with me. She is still doing Zumba during the week. Weight is up about 3 pounds since her last appointment with me.   Pending appointment with Dr. Lily Peer March 10, 2013. She has a pending appointment with her PCP in January 2015.   Patient states that she is working more overtime lately and is working 50-60 hours a week. She states that this has made it more difficult for her to be able to get out and exercise.    Disease Assessments:  Diabetes:  Sees Diabetes provider 2 times per year; MD managing Diabetes Hussein; checks feet daily; takes medications as prescribed; does not take an aspirin a day; uses glucometer; checks blood glucose 2-6 times a week;   Highest CBG 133; Lowest CBG 79; hypoglycemia frequency rarely; 7 day CBG average 112; 14 day CBG average 112; 30 day CBG average 100; Type of Diabetes: Pre-Diabetes;   Other Diabetes History: Patient brought her meter with her to the appointment. She reports that she was checking it every morning for last month and part of this month. Then she states that she was tired of checking her blood sugar so she is only checking it about 3 times a week.   CBGs are ranging usually in the 90s and low 100s. Today she checked a 2 hour post prandial reading and it was 133.    Tobacco Assessment:  Smoking Status: Never smoker; Last Reviewed: 02/27/2013     Physical Activity-  Zumba- Mondays and some Tuesdays. She states that since she has been working more overtime she is usually getting in once a week.   Nutrition-  She states that she has been eating more Halloween candy. She bought several bags when it was on sale and she is still eating the candy  now.   She states that now she is eating more vegetables and soups. No other nutrition changes.       Hemoglobin A1c: 02/27/2013 5.5    Dilated Eye Exam: 04/10/2012  Flu vaccine: 01/07/2013  Foot Exam: 04/08/2012  Other Preventive Care Notes: Last dental visit July 2014        Vital Signs:  02/27/2013 4:34 PM (EST) Blood Pressure 140 / 87 mm/HgBMI 32.4; Height 5 ft 2 in; Pulse Rate 78 bpm; Weight 177.2 lbs    Testing:  Blood Sugar Tests:  Hemoglobin A1c: 5.5 resulted on 02/27/2013       Assessment/Plan: Patient is a 47 year old female with pre-diabetes. A1C today was 5.5% which is meeting goal of less than 6.5%. Patient is taking metformin 500 mg once daily.  Patient continues to do well. She is still exercising, though admitedly not as much as she was previously. She is working long hours at work and has found it difficult to find time for physical activity. She reports that she has been eating more candy lately and she thinks that it part of the reasons why she has gained weight. Patient agreed to discard remaining candy.   We spent part of the visit discussing strategies to avoid gaining weight over the holidays, including limiting portion sizes and limiting concentrated sweet consumption.   Follow up with patient in 3 months..    Goals for Next Visit-  1. Get  rid of the rest of the Halloween candy. Throw it away or give it to someone else.  2. For breads- look at the food labels. Look for bread that is 100% whole grain and has a higher fiber content.  3. Around the holidays- be aware of portion sizes and don't overeat.  4. Cut back on orange juice and other beverages with calories.  Next appointment with me is Friday February 27th at 4 PM.

## 2013-03-10 ENCOUNTER — Ambulatory Visit (INDEPENDENT_AMBULATORY_CARE_PROVIDER_SITE_OTHER): Payer: 59 | Admitting: Gynecology

## 2013-03-10 ENCOUNTER — Encounter: Payer: Self-pay | Admitting: Gynecology

## 2013-03-10 VITALS — BP 126/90 | Ht 61.25 in | Wt 175.0 lb

## 2013-03-10 DIAGNOSIS — N898 Other specified noninflammatory disorders of vagina: Secondary | ICD-10-CM

## 2013-03-10 DIAGNOSIS — E785 Hyperlipidemia, unspecified: Secondary | ICD-10-CM

## 2013-03-10 DIAGNOSIS — A499 Bacterial infection, unspecified: Secondary | ICD-10-CM

## 2013-03-10 DIAGNOSIS — N7011 Chronic salpingitis: Secondary | ICD-10-CM

## 2013-03-10 DIAGNOSIS — Z01419 Encounter for gynecological examination (general) (routine) without abnormal findings: Secondary | ICD-10-CM

## 2013-03-10 DIAGNOSIS — B373 Candidiasis of vulva and vagina: Secondary | ICD-10-CM

## 2013-03-10 DIAGNOSIS — N760 Acute vaginitis: Secondary | ICD-10-CM

## 2013-03-10 DIAGNOSIS — E119 Type 2 diabetes mellitus without complications: Secondary | ICD-10-CM

## 2013-03-10 DIAGNOSIS — N7013 Chronic salpingitis and oophoritis: Secondary | ICD-10-CM

## 2013-03-10 DIAGNOSIS — B9689 Other specified bacterial agents as the cause of diseases classified elsewhere: Secondary | ICD-10-CM

## 2013-03-10 LAB — URINALYSIS W MICROSCOPIC + REFLEX CULTURE
Bacteria, UA: NONE SEEN
Glucose, UA: NEGATIVE mg/dL
Hgb urine dipstick: NEGATIVE
Ketones, ur: NEGATIVE mg/dL
Leukocytes, UA: NEGATIVE
Protein, ur: NEGATIVE mg/dL

## 2013-03-10 LAB — WET PREP FOR TRICH, YEAST, CLUE: Trich, Wet Prep: NONE SEEN

## 2013-03-10 LAB — CBC WITH DIFFERENTIAL/PLATELET
Basophils Relative: 1 % (ref 0–1)
Eosinophils Absolute: 0.1 10*3/uL (ref 0.0–0.7)
HCT: 38 % (ref 36.0–46.0)
Hemoglobin: 12.9 g/dL (ref 12.0–15.0)
MCH: 27.3 pg (ref 26.0–34.0)
MCHC: 33.9 g/dL (ref 30.0–36.0)
Monocytes Absolute: 0.7 10*3/uL (ref 0.1–1.0)
Monocytes Relative: 11 % (ref 3–12)
Neutrophils Relative %: 49 % (ref 43–77)

## 2013-03-10 LAB — COMPREHENSIVE METABOLIC PANEL
BUN: 10 mg/dL (ref 6–23)
CO2: 25 mEq/L (ref 19–32)
Calcium: 9.4 mg/dL (ref 8.4–10.5)
Chloride: 104 mEq/L (ref 96–112)
Creat: 0.86 mg/dL (ref 0.50–1.10)

## 2013-03-10 LAB — LIPID PANEL
HDL: 40 mg/dL (ref >39)
LDL Cholesterol: 135 mg/dL — ABNORMAL HIGH (ref 0–99)
Total CHOL/HDL Ratio: 5.3 Ratio
VLDL: 38 mg/dL (ref 0–40)

## 2013-03-10 LAB — TSH: TSH: 1.037 u[IU]/mL (ref 0.350–4.500)

## 2013-03-10 MED ORDER — FLUCONAZOLE 150 MG PO TABS: 150.0000 mg | ORAL_TABLET | Freq: Once | ORAL | Status: DC

## 2013-03-10 MED ORDER — METRONIDAZOLE 500 MG PO TABS: 500.0000 mg | ORAL_TABLET | Freq: Two times a day (BID) | ORAL | Status: DC

## 2013-03-10 NOTE — Patient Instructions (Addendum)
Monilial Vaginitis Vaginitis in a soreness, swelling and redness (inflammation) of the vagina and vulva. Monilial vaginitis is not a sexually transmitted infection. CAUSES  Yeast vaginitis is caused by yeast (candida) that is normally found in your vagina. With a yeast infection, the candida has overgrown in number to a point that upsets the chemical balance. SYMPTOMS   White, thick vaginal discharge.  Swelling, itching, redness and irritation of the vagina and possibly the lips of the vagina (vulva).  Burning or painful urination.  Painful intercourse. DIAGNOSIS  Things that may contribute to monilial vaginitis are:  Postmenopausal and virginal states.  Pregnancy.  Infections.  Being tired, sick or stressed, especially if you had monilial vaginitis in the past.  Diabetes. Good control will help lower the chance.  Birth control pills.  Tight fitting garments.  Using bubble bath, feminine sprays, douches or deodorant tampons.  Taking certain medications that kill germs (antibiotics).  Sporadic recurrence can occur if you become ill. TREATMENT  Your caregiver will give you medication.  There are several kinds of anti monilial vaginal creams and suppositories specific for monilial vaginitis. For recurrent yeast infections, use a suppository or cream in the vagina 2 times a week, or as directed.  Anti-monilial or steroid cream for the itching or irritation of the vulva may also be used. Get your caregiver's permission.  Painting the vagina with methylene blue solution may help if the monilial cream does not work.  Eating yogurt may help prevent monilial vaginitis. HOME CARE INSTRUCTIONS   Finish all medication as prescribed.  Do not have sex until treatment is completed or after your caregiver tells you it is okay.  Take warm sitz baths.  Do not douche.  Do not use tampons, especially scented ones.  Wear cotton underwear.  Avoid tight pants and panty  hose.  Tell your sexual partner that you have a yeast infection. They should go to their caregiver if they have symptoms such as mild rash or itching.  Your sexual partner should be treated as well if your infection is difficult to eliminate.  Practice safer sex. Use condoms.  Some vaginal medications cause latex condoms to fail. Vaginal medications that harm condoms are:  Cleocin cream.  Butoconazole (Femstat).  Terconazole (Terazol) vaginal suppository.  Miconazole (Monistat) (may be purchased over the counter). SEEK MEDICAL CARE IF:   You have a temperature by mouth above 102 F (38.9 C).  The infection is getting worse after 2 days of treatment.  The infection is not getting better after 3 days of treatment.  You develop blisters in or around your vagina.  You develop vaginal bleeding, and it is not your menstrual period.  You have pain when you urinate.  You develop intestinal problems.  You have pain with sexual intercourse. Document Released: 01/03/2005 Document Revised: 06/18/2011 Document Reviewed: 09/17/2008 Exit[employer] Patient Information 2014 [employer]. Bacterial Vaginosis Bacterial vaginosis (BV) is a vaginal infection where the normal balance of bacteria in the vagina is disrupted. The normal balance is then replaced by an overgrowth of certain bacteria. There are several different kinds of bacteria that can cause BV. BV is the most common vaginal infection in women of childbearing age. CAUSES   The cause of BV is not fully understood. BV develops when there is an increase or imbalance of harmful bacteria.  Some activities or behaviors can upset the normal balance of bacteria in the vagina and put women at increased risk including:  Having a new sex partner or multiple   sex partners.  Douching.  Using an intrauterine device (IUD) for contraception.  It is not clear what role sexual activity plays in the development of BV. However, women that have  never had sexual intercourse are rarely infected with BV. Women do not get BV from toilet seats, bedding, swimming pools or from touching objects around them.  SYMPTOMS   Grey vaginal discharge.  A fish-like odor with discharge, especially after sexual intercourse.  Itching or burning of the vagina and vulva.  Burning or pain with urination.  Some women have no signs or symptoms at all. DIAGNOSIS  Your caregiver must examine the vagina for signs of BV. Your caregiver will perform lab tests and look at the sample of vaginal fluid through a microscope. They will look for bacteria and abnormal cells (clue cells), a pH test higher than 4.5, and a positive amine test all associated with BV.  RISKS AND COMPLICATIONS   Pelvic inflammatory disease (PID).  Infections following gynecology surgery.  Developing HIV.  Developing herpes virus. TREATMENT  Sometimes BV will clear up without treatment. However, all women with symptoms of BV should be treated to avoid complications, especially if gynecology surgery is planned. Female partners generally do not need to be treated. However, BV may spread between female sex partners so treatment is helpful in preventing a recurrence of BV.   BV may be treated with antibiotics. The antibiotics come in either pill or vaginal cream forms. Either can be used with nonpregnant or pregnant women, but the recommended dosages differ. These antibiotics are not harmful to the baby.  BV can recur after treatment. If this happens, a second round of antibiotics will often be prescribed.  Treatment is important for pregnant women. If not treated, BV can cause a premature delivery, especially for a pregnant woman who had a premature birth in the past. All pregnant women who have symptoms of BV should be checked and treated.  For chronic reoccurrence of BV, treatment with a type of prescribed gel vaginally twice a week is helpful. HOME CARE INSTRUCTIONS   Finish all  medication as directed by your caregiver.  Do not have sex until treatment is completed.  Tell your sexual partner that you have a vaginal infection. They should see their caregiver and be treated if they have problems, such as a mild rash or itching.  Practice safe sex. Use condoms. Only have 1 sex partner. PREVENTION  Basic prevention steps can help reduce the risk of upsetting the natural balance of bacteria in the vagina and developing BV:  Do not have sexual intercourse (be abstinent).  Do not douche.  Use all of the medicine prescribed for treatment of BV, even if the signs and symptoms go away.  Tell your sex partner if you have BV. That way, they can be treated, if needed, to prevent reoccurrence. SEEK MEDICAL CARE IF:   Your symptoms are not improving after 3 days of treatment.  You have increased discharge, pain, or fever. MAKE SURE YOU:   Understand these instructions.  Will watch your condition.  Will get help right away if you are not doing well or get worse. FOR MORE INFORMATION  Division of STD Prevention (DSTDP), Centers for Disease Control and Prevention: www.cdc.gov/std American [employer] Association (ASHA): www.ashastd.org  Document Released: 03/26/2005 Document Revised: 06/18/2011 Document Reviewed: 11/05/2012 Exit[employer] Patient Information 2014 [employer].  

## 2013-03-10 NOTE — Progress Notes (Signed)
Alicia Gray [birth year] [id number]   History:    47 y.o.  for annual gyn exam with no complaints today. Patient has history of type 2 diabetes as well as hypercholesterolemia and is being followed by her primary physician Dr. Eula Listen for which she has appointment  To see him in January. 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 Patient with known left hydrosalpinx has complained on and off for some twinges in her left lower quadrant. Patient had an ultrasound in January of this year which demonstrated the following:  Absent uterus, right ovary normal. Left ovary echo-free follicle 21 x 17 x 21 mm. Continued presence of a tubular structure inferior to the left ovary measuring 16 x 7 x 15 mm which appears to be a small hydrosalpinx and has gotten smaller.  Patient's back seen her up to date. Patient with no prior history of abnormal Pap smears even before her hysterectomy.   Past medical history,surgical history, family history and social history were all reviewed and documented in the EPIC chart.  Gynecologic History Patient's last menstrual period was 03/04/2001. Contraception: status post hysterectomy Last Pap: 2012. Results were: normal Last mammogram: 2014. Results were: normal  Obstetric History OB History  Gravida Para Term Preterm AB SAB TAB Ectopic Multiple Living  3 0   3 3    0    # Outcome Date GA Lbr Len/2nd Weight Sex Delivery Anes PTL Lv  3 SAB           2 SAB           1 SAB                ROS: A ROS was performed and pertinent positives and negatives are included in the history.  GENERAL: No fevers or chills. HEENT: No change in vision, no earache, sore throat or sinus congestion. NECK: No pain or stiffness. CARDIOVASCULAR: No chest pain or pressure. No palpitations. PULMONARY: No shortness of breath, cough or wheeze. GASTROINTESTINAL: No abdominal pain, nausea, vomiting or diarrhea, melena or bright red  blood per rectum. GENITOURINARY: No urinary frequency, urgency, hesitancy or dysuria. MUSCULOSKELETAL: No joint or muscle pain, no back pain, no recent trauma. DERMATOLOGIC: No rash, no itching, no lesions. ENDOCRINE: No polyuria, polydipsia, no heat or cold intolerance. No recent change in weight. HEMATOLOGICAL: No anemia or easy bruising or bleeding. NEUROLOGIC: No headache, seizures, numbness, tingling or weakness. PSYCHIATRIC: No depression, no loss of interest in normal activity or change in sleep pattern.     Exam: chaperone present  BP 126/90  Ht 5' 1.25" (1.556 m)  Wt 175 lb (79.379 kg)  BMI 32.79 kg/m2  LMP 03/04/2001  Body mass index is 32.79 kg/(m^2).  General appearance : Well developed well nourished female. No acute distress HEENT: Neck supple, trachea midline, no carotid bruits, no thyroidmegaly Lungs: Clear to auscultation, no rhonchi or wheezes, or rib retractions  Heart: Regular rate and rhythm, no murmurs or gallops Breast:Examined in sitting and supine position were symmetrical in appearance, no palpable masses or tenderness,  no skin retraction, no nipple inversion, no nipple discharge, no skin discoloration, no axillary or supraclavicular lymphadenopathy Abdomen: no palpable masses or tenderness, no rebound or guarding Extremities: no edema or skin discoloration or tenderness  Pelvic:  Bartholin, Urethra, Skene Glands: Within normal limits             Vagina: thick white discharge  Cervix: absent  Uterus Absent  Adnexa  Without masses or tenderness  Anus and perineum  normal   Rectovaginal  normal sphincter tone without palpated masses or tenderness             Hemoccult not indicated   Wet prep: Moderate yeast, few clue cells, moderate WBC, too numerous to count bacteria  Assessment/Plan:  47 y.o. female for annual exam who was fasting today and wanted to get her blood work drawn today we she will have available for her appointment with her PCP in a few weeks.  Her blood pressure was found to be 126/90 but on repeat it was 132/88. 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 The following labs were ordered today: Comprehensive metabolic panel, hemoglobin A1c, TSH, CBC, and urinalysis. Pap smear not done today in accordance to the new guidelines. We discussed importance of regular exercise as well as calcium and vitamin D for osteoporosis prevention. The patient will be treated with a single dose Diflucan 150 mg by mouth for her vaginal yeast and Flagyl 500 mg twice a day for 5 days for her BV. Patient will return back in January for followup ultrasound on her left hydrosalpinx since she has had some discomfort on and off for the past few months.  Note: This dictation was prepared with  Dragon/digital dictation along withSmart phrase technology. Any transcriptional errors that result from this process are unintentional.   [name] MD, 9:28 AM 03/10/2013

## 2013-04-15 ENCOUNTER — Encounter: Payer: Self-pay | Admitting: Gynecology

## 2013-04-15 ENCOUNTER — Ambulatory Visit (INDEPENDENT_AMBULATORY_CARE_PROVIDER_SITE_OTHER): Payer: 59

## 2013-04-15 ENCOUNTER — Other Ambulatory Visit: Payer: Self-pay | Admitting: Gynecology

## 2013-04-15 ENCOUNTER — Ambulatory Visit (INDEPENDENT_AMBULATORY_CARE_PROVIDER_SITE_OTHER): Payer: 59 | Admitting: Gynecology

## 2013-04-15 VITALS — BP 124/76

## 2013-04-15 DIAGNOSIS — N83209 Unspecified ovarian cyst, unspecified side: Secondary | ICD-10-CM

## 2013-04-15 DIAGNOSIS — N831 Corpus luteum cyst of ovary, unspecified side: Secondary | ICD-10-CM

## 2013-04-15 DIAGNOSIS — N839 Noninflammatory disorder of ovary, fallopian tube and broad ligament, unspecified: Secondary | ICD-10-CM

## 2013-04-15 DIAGNOSIS — N7011 Chronic salpingitis: Secondary | ICD-10-CM

## 2013-04-15 DIAGNOSIS — N7013 Chronic salpingitis and oophoritis: Secondary | ICD-10-CM

## 2013-04-15 DIAGNOSIS — N83201 Unspecified ovarian cyst, right side: Secondary | ICD-10-CM

## 2013-04-15 NOTE — Patient Instructions (Signed)
Ovarian Cyst The ovaries are small organs that are on each side of the uterus. The ovaries are the organs that produce the female hormones, estrogen and progesterone. An ovarian cyst is a sac filled with fluid that can vary in its size. It is normal for a small cyst to form in women who are in the childbearing age and who have menstrual periods. This type of cyst is called a follicle cyst that becomes an ovulation cyst (corpus luteum cyst) after it produces the women's egg. It later goes away on its own if the woman does not become pregnant. There are other kinds of ovarian cysts that may cause problems and may need to be treated. The most serious problem is a cyst with cancer. It should be noted that menopausal women who have an ovarian cyst are at a higher risk of it being a cancer cyst. They should be evaluated very quickly, thoroughly and followed closely. This is especially true in menopausal women because of the high rate of ovarian cancer in women in menopause. CAUSES AND TYPES OF OVARIAN CYSTS:  FUNCTIONAL CYST: The follicle/corpus luteum cyst is a functional cyst that occurs every month during ovulation with the menstrual cycle. They go away with the next menstrual cycle if the woman does not get pregnant. Usually, there are no symptoms with a functional cyst.  ENDOMETRIOMA CYST: This cyst develops from the lining of the uterus tissue. This cyst gets in or on the ovary. It grows every month from the bleeding during the menstrual period. It is also called a "chocolate cyst" because it becomes filled with blood that turns brown. This cyst can cause pain in the lower abdomen during intercourse and with your menstrual period.  CYSTADENOMA CYST: This cyst develops from the cells on the outside of the ovary. They usually are not cancerous. They can get very big and cause lower abdomen pain and pain with intercourse. This type of cyst can twist on itself, cut off its blood supply and cause severe pain. It  also can easily rupture and cause a lot of pain.  DERMOID CYST: This type of cyst is sometimes found in both ovaries. They are found to have different kinds of body tissue in the cyst. The tissue includes skin, teeth, hair, and/or cartilage. They usually do not have symptoms unless they get very big. Dermoid cysts are rarely cancerous.  POLYCYSTIC OVARY: This is a rare condition with hormone problems that produces many small cysts on both ovaries. The cysts are follicle-like cysts that never produce an egg and become a corpus luteum. It can cause an increase in body weight, infertility, acne, increase in body and facial hair and lack of menstrual periods or rare menstrual periods. Many women with this problem develop type 2 diabetes. The exact cause of this problem is unknown. A polycystic ovary is rarely cancerous.  THECA LUTEIN CYST: Occurs when too much hormone (human chorionic gonadotropin) is produced and over-stimulates the ovaries to produce an egg. They are frequently seen when doctors stimulate the ovaries for invitro-fertilization (test tube babies).  LUTEOMA CYST: This cyst is seen during pregnancy. Rarely it can cause an obstruction to the birth canal during labor and delivery. They usually go away after delivery. SYMPTOMS   Pelvic pain or pressure.  Pain during sexual intercourse.  Increasing girth (swelling) of the abdomen.  Abnormal menstrual periods.  Increasing pain with menstrual periods.  You stop having menstrual periods and you are not pregnant. DIAGNOSIS  The diagnosis can   You stop having menstrual periods and you are not pregnant.  DIAGNOSIS   The diagnosis can be made during:   Routine or annual pelvic examination (common).   Ultrasound.   X-ray of the pelvis.   CT Scan.   MRI.   Blood tests.  TREATMENT    Treatment may only be to follow the cyst monthly for 2 to 3 months with your caregiver. Many go away on their own, especially functional cysts.   May be aspirated (drained) with a long needle with ultrasound, or by laparoscopy (inserting a tube into  the pelvis through a small incision).   The whole cyst can be removed by laparoscopy.   Sometimes the cyst may need to be removed through an incision in the lower abdomen.   Hormone treatment is sometimes used to help dissolve certain cysts.   Birth control pills are sometimes used to help dissolve certain cysts.  HOME CARE INSTRUCTIONS   Follow your caregiver's advice regarding:   Medicine.   Follow up visits to evaluate and treat the cyst.   You may need to come back or make an appointment with another caregiver, to find the exact cause of your cyst, if your caregiver is not a gynecologist.   Get your yearly and recommended pelvic examinations and Pap tests.   Let your caregiver know if you have had an ovarian cyst in the past.  SEEK MEDICAL CARE IF:    Your periods are late, irregular, they stop, or are painful.   Your stomach (abdomen) or pelvic pain does not go away.   Your stomach becomes larger or swollen.   You have pressure on your bladder or trouble emptying your bladder completely.   You have painful sexual intercourse.   You have feelings of fullness, pressure, or discomfort in your stomach.   You lose weight for no apparent reason.   You feel generally ill.   You become constipated.   You lose your appetite.   You develop acne.   You have an increase in body and facial hair.   You are gaining weight, without changing your exercise and eating habits.   You think you are pregnant.  SEEK IMMEDIATE MEDICAL CARE IF:    You have increasing abdominal pain.   You feel sick to your stomach (nausea) and/or vomit.   You develop a fever that comes on suddenly.   You develop abdominal pain during a bowel movement.   Your menstrual periods become heavier than usual.  Document Released: 03/26/2005 Document Revised: 06/18/2011 Document Reviewed: 01/27/2009  [employer] Patient Information 2014 [employer].

## 2013-04-15 NOTE — Progress Notes (Signed)
   Patient is a 48 year old who presented to the office today for followup ultrasound as a result of a previously seen left hydrosalpinx. Patient is asymptomatic today. 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  Patient's ultrasound in January 2014 demonstrated the following: Absent uterus, right ovary normal. Left ovary echo-free follicle 21 x 17 x 21 mm. Continued presence of a tubular structure inferior to the left ovary measuring 16 x 7 x 15 mm which appears to be a small hydrosalpinx and has gotten smaller  Followup ultrasound today: Uterus absent. Right ovary with thick wall corpus luteum cyst measured 15 x 13 mm. Thin wall cyst within septum measuring 22 x 22 x 23 mm avascular was noted. 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 Essentially no change from 1 year ago. No fluid in the cul-de-sac.  Assessment/plan: Patient with small left hydrosalpinx unchanged from one year ago asymptomatic. Incidental finding of a thin-walled cyst within septum measuring 22 x 22 x 23 mm avascular noted on the right ovary. Patient will return back in 6 months for followup ultrasound. We did discuss the limitations of CA 125. We will hold off and reassess in 6 months or sooner if she were to become symptomatic. Patient denied any family history of ovarian cancer.

## 2013-04-17 ENCOUNTER — Telehealth: Payer: Self-pay

## 2013-04-17 NOTE — Telephone Encounter (Signed)
I replied back to patient on the questionnaire and let her know.

## 2013-04-17 NOTE — Telephone Encounter (Signed)
No,  I did not mention this blood pressure medication. She was to be monitoring her blood pressure readings as discussed on previous office visit. Please see note. Her PCP is also monitoring her blood pressure as well as her other medical conditions.

## 2013-04-17 NOTE — Telephone Encounter (Signed)
Patient sent her patient questionnaire back to [place]. The last question asks "Is there anything you would like to ask your physician?".  Patient replied "Yes, was the blood pressure medicine that you spoke about, was it Lorsartan?".

## 2013-04-20 NOTE — Progress Notes (Signed)
Patient ID: Alicia Gray, female   DOB: 05/28/1965, 47 y.o.   MRN: [id number] ATTENDING PHYSICIAN NOTE: I have reviewed the chart and agree with the plan as detailed above. Carletta Feasel MD Pager [phone]  

## 2013-04-20 NOTE — Progress Notes (Signed)
Patient ID: Alicia Gray, female   DOB: [birth year], 48 y.o.   MRN: [id number] ATTENDING PHYSICIAN NOTE: I have reviewed the chart and agree with the plan as detailed above. Dorcas Mcmurray MD Pager [phone]

## 2013-06-08 ENCOUNTER — Ambulatory Visit (INDEPENDENT_AMBULATORY_CARE_PROVIDER_SITE_OTHER): Payer: Self-pay | Admitting: Family Medicine

## 2013-06-08 VITALS — BP 141/87 | HR 71 | Ht 62.0 in | Wt 173.0 lb

## 2013-06-08 DIAGNOSIS — R7303 Prediabetes: Secondary | ICD-10-CM

## 2013-06-08 DIAGNOSIS — R7309 Other abnormal glucose: Secondary | ICD-10-CM

## 2013-06-08 NOTE — Progress Notes (Signed)
Subjective:  Patient presents today for 3 month diabetes follow-up as part of the employer-sponsored [employer] program. Current diabetes regimen includes metformin 500 mg once daily. Patient also continues on ARB and statin.   Last visit with Dr. Deforest Hoyles was in January 2015. At that time A1C was 6.3%.   She was placed on losartan 25 mg once daily at her last appointment. She was instructed to take the Losartan if her BP was greater than 130/80. She has been checking her BP twice daily. She states that she is normally around 120/80-130/80. She states that she has taken it 6 times since she had it filled in January. She returned for a 2 week BP check up at the beginning of February it was 120/80.    Disease Assessments:  Diabetes:  Sees Diabetes provider 2 times per year; MD managing Diabetes Hussein; checks feet daily; takes medications as prescribed; does not take an aspirin a day; uses glucometer; checks blood glucose 2-6 times a week; Type of Diabetes: Pre-Diabetes; Highest CBG 94; Lowest CBG 87; hypoglycemia frequency rarely;   Other Diabetes History: Patient did not bring her meter to the appointment. She states she is checking once daily. On average she states it is running around 94. She is checking fasting. She reports the highest was 99 and the lowest was 87.   She denies hypoglycemia.    Tobacco Assessment:  Smoking Status: Never smoker; Last Reviewed: 06/05/2013    Physical Activity-  No longer doing zumba. She is working overtime at work. 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 Occasionally on the weekend she goes walking with her Mom.   Nutrition-  She states that she feels like she is eating healthier. She has been eating more nuts lately and dried fruits. She has been trying to back off on breads and pastas. She has cut back so that she is either eating bread one day or pasta one day.   She has lost 4.2 pounds since her last  visit. She thinks this is because she has backed off on eating candy and has limited her sweet intake.   She has cut out orange juice (one of her goals from last time). She denies sweet tea or regular soda.     Learning Preference Assessment: Learner: Patient Readiness to Learn Barriers: Fatigue Teaching Method: Explanation Evaluation of Learning: Needs review and assistance  Preventive Care:    Hemoglobin A1c: 04/13/2013 6.3    Dilated Eye Exam: 04/10/2012  Flu vaccine: 01/07/2013  Foot Exam: 04/08/2012  Other Preventive Care Notes: Last dental visit July 2014        Vital Signs:  06/08/2013 11:39 AM (EST) Blood Pressure 141 / 87 mm/HgBMI 31.4; Height 5 ft 2.25 in; Pulse Rate 71 bpm; Weight 173 lbs    Testing:  Blood Sugar Tests:  Hemoglobin A1c: 6.3 via [facility] dr. Deforest Hoyles resulted on 04/13/2013     Historical Lab Results:  Total Cholesterol 168; Triglycerides 156; HDL Cholesterol 38; LDL Cholesterol 99  labs from [facility], 04/13/13 Microalbumin- 4.5; AST/ALT WNL    Assessment/Plan: Patient is a 48 year old female with pre-diabetes. Most recent A1C was 6.3% with Dr. Deforest Hoyles in January. This is still less than the diagnostic criteria for DM, but is an increase from the last A1C of 5.5%. This coincides with a decrease in physical activity (patient is working overtime and has essentially  stopped physical activity), and a previous weight gain. However, since her last appointment with me 3 months ago she has lost four pounds.  Patient will soon no longer be working overtime with her job. We discussed getting back into the habit of attending zumba classes 2-3 times a week. Patient wants to lose weight and we discussed increased physical activity as a way to accomplish this. We spent the majority of the visit discussing weight loss strategies and the benefits of reducing BMI to less than 30. I challenged her to start with a 5% weight loss which would be a 7  pound reduction from her current weight.   We also spent part of the visit reviewing carbohydrates and which foods contained carbohydrates. We also discussed using the plate method for when she eats, especially when she eats out. She is a vegetarian and does not eat meat and eats a lot of legumes and beans. I explained to her that beans have both protein and carbohydrates and that could count for both areas of her plate. She has cut back on concentrated sweet intake and is no longer eating M&Ms.   Patient had several questions regarding her BP and the need for BP meds. She states that when she sees Dr. Deforest Hoyles her BP is elevated, above 140/90. When she checks BP at home, SBP is around 120 or 130. In clinic today BP was 141/87. I explained that she could have white coat HTN. She was worried about taking losartan because she thought it was a high dose. I explained that 25 mg losartan was a very low dose and could actually provide some renal protection for her in light of pre-diabetes. She agreed to continue taking losartan on days when self BP check shows an elevation (SBP>140).   Follow up with patient after I return from maternity leave..   Goals for Next Visit-  1. Continue to lose weight. Aim for a 5% reduction in weight (this would put you at 165 pounds).  2. For pasta- the next time you eat, measure out 1 cup so you know what 1 cup looks like.  3. When you are eating out, try to follow the plate method (1/2 non starchy veggies, 1/4 carbohydrates, 1/4 protein).  4. Start going back to zumba classes 2-3 times a week.  Next appointment to see me is Monday July 27th at 4 PM.

## 2013-10-12 ENCOUNTER — Ambulatory Visit (INDEPENDENT_AMBULATORY_CARE_PROVIDER_SITE_OTHER): Payer: 59 | Admitting: Gynecology

## 2013-10-12 ENCOUNTER — Ambulatory Visit (INDEPENDENT_AMBULATORY_CARE_PROVIDER_SITE_OTHER): Payer: 59

## 2013-10-12 ENCOUNTER — Encounter: Payer: Self-pay | Admitting: Gynecology

## 2013-10-12 ENCOUNTER — Other Ambulatory Visit: Payer: 59

## 2013-10-12 ENCOUNTER — Ambulatory Visit: Payer: 59 | Admitting: Gynecology

## 2013-10-12 ENCOUNTER — Other Ambulatory Visit: Payer: Self-pay | Admitting: Gynecology

## 2013-10-12 DIAGNOSIS — N831 Corpus luteum cyst of ovary, unspecified side: Secondary | ICD-10-CM

## 2013-10-12 DIAGNOSIS — N7013 Chronic salpingitis and oophoritis: Secondary | ICD-10-CM

## 2013-10-12 DIAGNOSIS — N83209 Unspecified ovarian cyst, unspecified side: Secondary | ICD-10-CM

## 2013-10-12 DIAGNOSIS — N7011 Chronic salpingitis: Secondary | ICD-10-CM

## 2013-10-12 DIAGNOSIS — N83201 Unspecified ovarian cyst, right side: Secondary | ICD-10-CM

## 2013-10-12 MED ORDER — MEDROXYPROGESTERONE ACETATE 150 MG/ML IM SUSP
150.0000 mg | Freq: Once | INTRAMUSCULAR | Status: AC
  Administered 2013-10-12: 150 mg via INTRAMUSCULAR

## 2013-10-12 NOTE — Progress Notes (Signed)
   Patient presented to the office today for followup ultrasound of a previously seen left hydrosalpinx. Patient has been asymptomatic.  Patient's ultrasound in January 2014 demonstrated the following:  Absent uterus, right ovary normal. Left ovary echo-free follicle 21 x 17 x 21 mm. Continued presence of a tubular structure inferior to the left ovary measuring 16 x 7 x 15 mm which appears to be a small hydrosalpinx and has gotten Smaller.    Ultrasound 04/15/2013:  Uterus absent. Right ovary with thick wall corpus luteum cyst measured 15 x 13 mm. Thin wall cyst within septum measuring 22 x 22 x 23 mm avascular was noted. 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 Essentially no change from 1 year ago. No fluid in the cul-de-sac.   Ultrasound today: Absent uterus. Right ovary with thin wall cyst internal level echo measuring 23 x 16 x 16 mm avascular otherwise. Left ovary thinwall cyst measuring 22 x 23 x 23 mm with several thin septation reticular echo pattern avascular. No fluid in the cul-de-sac. Tubular left cystic area measuring 16 x 7 x 15 mm essentially no change.  Assessment/plan: Patient with persistent stable left hydrosalpinx asymptomatic now these 2 cysts that recently appeared we will followup with an ultrasound in 4 months after she receives Depo-Provera 150 mg IM today. We will check a CA 125. Its limitations discussed. Patient had a normal CA 125 in November 2012. Literature information on ovarian cyst was provided.

## 2013-10-12 NOTE — Patient Instructions (Addendum)
Ovarian Cyst An ovarian cyst is a fluid-filled sac that forms on an ovary. The ovaries are small organs that produce eggs in women. Various types of cysts can form on the ovaries. Most are not cancerous. Many do not cause problems, and they often go away on their own. Some may cause symptoms and require treatment. Common types of ovarian cysts include:  Functional cysts--These cysts may occur every month during the menstrual cycle. This is normal. The cysts usually go away with the next menstrual cycle if the woman does not get pregnant. Usually, there are no symptoms with a functional cyst.  Endometrioma cysts--These cysts form from the tissue that lines the uterus. They are also called "chocolate cysts" because they become filled with blood that turns brown. This type of cyst can cause pain in the lower abdomen during intercourse and with your menstrual period.  Cystadenoma cysts--This type develops from the cells on the outside of the ovary. These cysts can get very big and cause lower abdomen pain and pain with intercourse. This type of cyst can twist on itself, cut off its blood supply, and cause severe pain. It can also easily rupture and cause a lot of pain.  Dermoid cysts--This type of cyst is sometimes found in both ovaries. These cysts may contain different kinds of body tissue, such as skin, teeth, hair, or cartilage. They usually do not cause symptoms unless they get very big.  Theca lutein cysts--These cysts occur when too much of a certain hormone (human chorionic gonadotropin) is produced and overstimulates the ovaries to produce an egg. This is most common after procedures used to assist with the conception of a baby (in vitro fertilization). CAUSES   Fertility drugs can cause a condition in which multiple large cysts are formed on the ovaries. This is called ovarian hyperstimulation syndrome.  A condition called polycystic ovary syndrome can cause hormonal imbalances that can lead to  nonfunctional ovarian cysts. SIGNS AND SYMPTOMS  Many ovarian cysts do not cause symptoms. If symptoms are present, they may include:  Pelvic pain or pressure.  Pain in the lower abdomen.  Pain during sexual intercourse.  Increasing girth (swelling) of the abdomen.  Abnormal menstrual periods.  Increasing pain with menstrual periods.  Stopping having menstrual periods without being pregnant. DIAGNOSIS  These cysts are commonly found during a routine or annual pelvic exam. Tests may be ordered to find out more about the cyst. These tests may include:  Ultrasound.  X-ray of the pelvis.  CT scan.  MRI.  Blood tests. TREATMENT  Many ovarian cysts go away on their own without treatment. Your health care provider may want to check your cyst regularly for 2-3 months to see if it changes. For women in menopause, it is particularly important to monitor a cyst closely because of the higher rate of ovarian cancer in menopausal women. When treatment is needed, it may include any of the following:  A procedure to drain the cyst (aspiration). This may be done using a long needle and ultrasound. It can also be done through a laparoscopic procedure. This involves using a thin, lighted tube with a tiny camera on the end (laparoscope) inserted through a small incision.  Surgery to remove the whole cyst. This may be done using laparoscopic surgery or an open surgery involving a larger incision in the lower abdomen.  Hormone treatment or birth control pills. These methods are sometimes used to help dissolve a cyst. HOME CARE INSTRUCTIONS   Only take over-the-counter   or prescription medicines as directed by your health care provider.  Follow up with your health care provider as directed.  Get regular pelvic exams and Pap tests. SEEK MEDICAL CARE IF:   Your periods are late, irregular, or painful, or they stop.  Your pelvic pain or abdominal pain does not go away.  Your abdomen becomes  larger or swollen.  You have pressure on your bladder or trouble emptying your bladder completely.  You have pain during sexual intercourse.  You have feelings of fullness, pressure, or discomfort in your stomach.  You lose weight for no apparent reason.  You feel generally ill.  You become constipated.  You lose your appetite.  You develop acne.  You have an increase in body and facial hair.  You are gaining weight, without changing your exercise and eating habits.  You think you are pregnant. SEEK IMMEDIATE MEDICAL CARE IF:   You have increasing abdominal pain.  You feel sick to your stomach (nauseous), and you throw up (vomit).  You develop a fever that comes on suddenly.  You have abdominal pain during a bowel movement.  Your menstrual periods become heavier than usual. MAKE SURE YOU:  Understand these instructions.  Will watch your condition.  Will get help right away if you are not doing well or get worse. Document Released: 03/26/2005 Document Revised: 03/31/2013 Document Reviewed: 12/01/2012 [facility] Patient Information 2015 [place], [place]. This information is not intended to replace advice given to you by your health care provider. Make sure you discuss any questions you have with your health care provider. CA-125 Tumor Marker CA 125 is a tumor marker that is used to help monitor the course of ovarian or endometrial cancer. PREPARATION FOR TEST No preparation is necessary. NORMAL FINDINGS Adults: 0-35 units/mL (0-35 kilounits)/L Ranges for normal findings may vary among different laboratories and hospitals. You should always check with your doctor after having lab work or other tests done to discuss the meaning of your test results and whether your values are considered within normal limits. MEANING OF TEST  Your caregiver will go over the test results with you and discuss the importance and meaning of your results, as well as treatment options and the  need for additional tests if necessary. OBTAINING THE TEST RESULTS It is your responsibility to obtain your test results. Ask the lab or department performing the test when and how you will get your results. Document Released: 04/17/2004 Document Revised: 06/18/2011 Document Reviewed: 03/03/2008 [facility] Patient Information 2015 [place], [place]. This information is not intended to replace advice given to you by your health care provider. Make sure you discuss any questions you have with your health care provider.

## 2013-10-13 LAB — CA 125: CA 125: 7.8 U/mL (ref 0.0–30.2)

## 2013-11-02 ENCOUNTER — Ambulatory Visit (INDEPENDENT_AMBULATORY_CARE_PROVIDER_SITE_OTHER): Payer: Self-pay | Admitting: Family Medicine

## 2013-11-02 VITALS — BP 122/82 | Ht 62.25 in | Wt 174.0 lb

## 2013-11-02 DIAGNOSIS — E119 Type 2 diabetes mellitus without complications: Secondary | ICD-10-CM

## 2013-11-02 NOTE — Progress Notes (Signed)
Subjective:  Patient presents today for 3 month diabetes follow-up as part of the employer-sponsored Link to Wellness program. Current diabetes regimen includes metformin 500 mg once daily. Patient also continues on daily ARB and statin.  Last visit with Dr. Deforest Hoyles was in July 2015. Most recent A1C was 6.3%. This is unchanged since her last visit.   She is now taking losartan 25 mg daily. She is only taking metformin 500 mg once daily. Patient states that she takes all of her medications, but she is late on getting refills. She has a cyst on her left ovary. She took a Depo-Provera shot to see if it would shrink it. She has yearly ultrasounds because of a history of fibroids.   Disease Assessments:  Diabetes: Sees Diabetes provider 2 times per year; MD managing Diabetes Hussein; checks feet daily; takes medications as prescribed; does not take an aspirin a day; uses glucometer;   checks blood glucose 2-6 times a week;  hypoglycemia frequency rarely; Highest CBG 95; Lowest CBG 85;   Other Diabetes History: She states she is checking CBGs daily. The highest is 95, lowest 85. Denies hypoglycemia. She did not bring her meter with her to the appointment. She is checking fasting blood sugar. On average she thinks it is around 89.   Tobacco Assessment: Smoking Status: Never smoker; Last Reviewed: 11/02/2013   Social History:  Caffeine use: 0 servings per day; Denies alcohol use; Diet adherence 50-75% of the time; Medication adherence adherent; Patient can afford medications; Patient knows the purpose/use of medications; Exercise adherence Never; 0 minutes of exercise per week.  Physical Activity- She has stopped working overtime. She is doing some yard work.  Nutrition-  She admits to struggling with snacks in the afternoon and when she gets home from work. She is having hard time with sweets. She thinks that this is a few times a week (1-3 times). She states she is snacking because she knows that it  is there. She really likes chocolate.  She states that she has cut back on bread intake. She states that she is eating a lot of fruits and vegetables.    Preventive Care:    Hemoglobin A1c: 04/13/2013 6.3    Dilated Eye Exam: 04/10/2012  Flu vaccine: 01/07/2013  Foot Exam: 04/08/2012  Other Preventive Care Notes:  Last dental visit July 2014      Vital Signs:  11/02/2013 4:57 PM (EST)Blood Pressure 122 / 82 mm/HgBMI 31.6; Height 5 ft 2.25 in; Weight 174 lbs   Testing:  Blood Sugar Tests: Hemoglobin A1c: 6.3 via Dr. Hussain july 2015          Assessment/Plan: Patient is a 48 year old female with DM2. Most recent A1C is 6.3% and is meeting goal of less than 7%. Weight is stable from last visit. BMI is 31. Patient admits that she is having trouble with snacking, especially on concentrated sweets like chocolate. We discussed strategies to limit snacking and to change to snacking on healthier items. Patient has stopped exercising also. She expressed a desire to start again and agreed to get back into zumba. Follow up with patient in 3 months. .      Goals for Next Visit- 1. Get back to zumba classes and using the exercise room (elliptical and treadmill). Aim for 3 days a week for 30 minutes. Good days to go- Mondays, Tuesdays, Thursdays and Fridays. Remember your motivations to exercise- weight loss, feeling good, better blood sugars, getting off medications. 2. Ideas to  combat the stinky exercise room- put some essential oils on your wrist and also in your towel so you can smell that instead of the stank. 3. Reach for healthier snacks in the afternoon. Instead of chocolate candy, try a Fiber One chocolate bar. 4. Log into the wellness website and see where you are in reaching all 5 badges. If you are close, try and earn all five! 5. Dietitian information- [facility] [phone] Next appointment  to see me is Monday October 26th at 4 PM.

## 2013-11-19 ENCOUNTER — Encounter: Payer: Self-pay | Admitting: Dietician

## 2013-11-19 ENCOUNTER — Encounter: Payer: 59 | Attending: Internal Medicine | Admitting: Dietician

## 2013-11-19 VITALS — Ht 62.25 in | Wt 176.6 lb

## 2013-11-19 DIAGNOSIS — E669 Obesity, unspecified: Secondary | ICD-10-CM | POA: Diagnosis present

## 2013-11-19 DIAGNOSIS — Z713 Dietary counseling and surveillance: Secondary | ICD-10-CM | POA: Insufficient documentation

## 2013-11-19 DIAGNOSIS — I10 Essential (primary) hypertension: Secondary | ICD-10-CM | POA: Insufficient documentation

## 2013-11-19 DIAGNOSIS — R7309 Other abnormal glucose: Secondary | ICD-10-CM | POA: Insufficient documentation

## 2013-11-19 DIAGNOSIS — Z6832 Body mass index (BMI) 32.0-32.9, adult: Secondary | ICD-10-CM | POA: Diagnosis not present

## 2013-11-19 NOTE — Patient Instructions (Addendum)
-  Watch portions of starches  -Have 2 to 3 servings of carbs per meal -Pre-portion nuts and snack foods into baggies, plate, or bowl -Practice reading food label  -Focus on carbs, sodium, and serving size -Use measuring cups and spoons -Watch portions of salad dressing  -Gradually reduce amount  -Dip fork in dressing first -Buy prepackaged nuts -Avoid keeping "trigger" foods in the house

## 2013-11-19 NOTE — Progress Notes (Signed)
  Medical Nutrition Therapy:  Appt start time: 1110 end time:  1215.   Assessment:  Primary concerns today: Alicia Gray is here today to discuss weight loss. Came to [facility] in 2012. Has since developed prediabetes and HTN. She follows a vegetarian diet and eats peanut butter, eggs, beans, dairy products for protein needs. Has lost almost 25 pounds in the last year and a half. Has arthritis in her back. She works from 7:30am-4:30pm M-F at [employer]. Elisabeth often has church and work activities in the evenings and on weekends.    Preferred Learning Style:   No preference indicated   Learning Readiness:   Contemplating  Ready   MEDICATIONS: see list   DIETARY INTAKE:  Avoided foods include aspartame, meat.    24-hr recall:  B ( AM): packet of brown sugar and cinnamon instant oatmeal and water  Snk ( AM): chocolate chip granola bar  L ( PM): varies; salad, leftovers Snk ( PM): cheese stick D ( PM): Tbsp of peanut butter, chips, and fruit  Snk ( PM): none  Beverages: water   Usual physical activity: zumba 2x a week, walking every day for 30-45 minutes, elliptical, yardwork  Estimated energy needs: 1600-1800 calories 180-200 g carbohydrates  Progress Towards Goal(s):  In progress.   Nutritional Diagnosis:  [place]-2.2 Altered nutrition-related laboratory As related to obesity and excessive carbohydrate intake.  As evidenced by HgbA1c 6.3%.    Intervention:  Nutrition counseling provided. Recommended 2-3 carb servings per meal and 1 at snacks. Discussed vegetarian protein options. Reviewed process of blood glucose uptake by cells.  -Watch portions of starches  -Have 2 to 3 servings of carbs per meal -Pre-portion nuts and snack foods into baggies, plate, or bowl -Practice reading food label  -Focus on carbs, sodium, and serving size -Use measuring cups and spoons -Watch portions of salad dressing  -Gradually reduce amount  -Dip fork in dressing first -Buy prepackaged  nuts -Avoid keeping "trigger" foods in the house  Teaching Method Utilized:  Visual Auditory Hands on  Handouts given during visit include:  Low sodium flavoring tips  Vegetarian proteins  Carbohydrate servings card  15g CHO + protein snacks  MyPlate  Barriers to learning/adherence to lifestyle change: food preferences  Demonstrated degree of understanding via:  Teach Back   Monitoring/Evaluation:  Dietary intake, exercise, labs, and body weight in 2 month(s).

## 2013-12-10 ENCOUNTER — Encounter: Payer: Self-pay | Admitting: Family Medicine

## 2013-12-10 NOTE — Progress Notes (Signed)
Patient ID: Alicia Gray, female   DOB: 1965/11/24, 48 y.o.   MRN: [id number] Reviewed: Agree with the documentation and management of our Alicia Gray.

## 2013-12-10 NOTE — Progress Notes (Signed)
Patient ID: Alicia Gray, female   DOB: 1965/10/07, 48 y.o.   MRN: [id number] Reviewed: Agree with the documentation and management of our [place].

## 2014-01-08 ENCOUNTER — Ambulatory Visit
Admission: RE | Admit: 2014-01-08 | Discharge: 2014-01-08 | Disposition: A | Discharge status: Home / Self Care | Payer: 59 | Source: Ambulatory Visit | Attending: Internal Medicine | Admitting: Internal Medicine

## 2014-01-08 ENCOUNTER — Other Ambulatory Visit: Payer: Self-pay | Admitting: Internal Medicine

## 2014-01-08 DIAGNOSIS — M5440 Lumbago with sciatica, unspecified side: Secondary | ICD-10-CM

## 2014-01-14 ENCOUNTER — Ambulatory Visit: Payer: 59 | Attending: Internal Medicine

## 2014-01-14 DIAGNOSIS — I10 Essential (primary) hypertension: Secondary | ICD-10-CM | POA: Diagnosis not present

## 2014-01-14 DIAGNOSIS — J45909 Unspecified asthma, uncomplicated: Secondary | ICD-10-CM | POA: Insufficient documentation

## 2014-01-14 DIAGNOSIS — M545 Low back pain: Secondary | ICD-10-CM | POA: Insufficient documentation

## 2014-01-14 DIAGNOSIS — Z5189 Encounter for other specified aftercare: Secondary | ICD-10-CM | POA: Insufficient documentation

## 2014-01-14 DIAGNOSIS — E119 Type 2 diabetes mellitus without complications: Secondary | ICD-10-CM | POA: Diagnosis not present

## 2014-01-14 DIAGNOSIS — M533 Sacrococcygeal disorders, not elsewhere classified: Secondary | ICD-10-CM | POA: Insufficient documentation

## 2014-01-22 ENCOUNTER — Other Ambulatory Visit: Payer: Self-pay

## 2014-01-26 ENCOUNTER — Ambulatory Visit: Payer: 59

## 2014-01-27 ENCOUNTER — Encounter: Payer: Self-pay | Admitting: Gynecology

## 2014-01-28 ENCOUNTER — Ambulatory Visit: Payer: 59

## 2014-01-28 DIAGNOSIS — Z5189 Encounter for other specified aftercare: Secondary | ICD-10-CM | POA: Diagnosis not present

## 2014-02-01 ENCOUNTER — Ambulatory Visit (INDEPENDENT_AMBULATORY_CARE_PROVIDER_SITE_OTHER): Payer: Self-pay | Admitting: Family Medicine

## 2014-02-01 VITALS — BP 122/80 | Ht 62.25 in | Wt 175.0 lb

## 2014-02-01 DIAGNOSIS — E119 Type 2 diabetes mellitus without complications: Secondary | ICD-10-CM

## 2014-02-01 NOTE — Progress Notes (Signed)
Subjective:    Patient presents today for 3 month diabetes follow-up as part of the employer-sponsored Link to Wellness program.  Current diabetes regimen includes metformin 500 mg once daily.  Patient also continues on daily ARB and statin.  Patient has a pending appointment with Dr. Deforest Hoyles in January. No med changes or major health changes at this time.      Patient states that she has been having trouble with back pain. She has degenerative disc disease with end plate scoliosis. She has been going to physical therapy for the past 2 weeks and states that this has been helping.     She states that she gets indigestion with losartan. Recommended taking with dinner.     Refilled prescriptions for patient today. No medication changes.      Disease Assessments:    Diabetes:  Sees Diabetes provider 2 times per year; MD managing Diabetes Hussein; checks feet daily; takes medications as prescribed; does not take an aspirin a day; uses glucometer;  checks blood glucose 2-6 times a week;  hypoglycemia frequency rarely; Highest CBG 93;   Other Diabetes History:  She did not bring her meter with her to this appointment.  She states that she is checking her blood sugar once daily fasting. She states that CBG is running in the high 80s. She denies hypoglycemia. She states 66 is the highest it has been.     A1C today was 6.2% which is stable from her last visit with me at 6.3%.         Physical Activity-  She is doing physical therapy twice a week. She is doing exercises at home as "homework" along with PT.   Nutrition-  It's Halloween season and she admits to eating more chocolate lately. She states that she has decreased consumption of breads, pasta and cheese. She used to eat cheese with crackers and thinks that she was eating too much cheese. She is not eating any cheese now. She is eating crackers and peanut butter as a snack with night. She saw a dietitian in August. The RD  told her that she was doing pretty good but that she could drop her portion sizes.      She states that she has cut back on bread intake. She states that she is eating a lot of fruits and vegetables.     Vital Signs:    02/01/2014 4:58 PM (EST) Blood Pressure 122 / 80 mm/HgBMI 31.7; Height 5 ft 2.25 in; Weight 175 lbs     Testing:    Blood Sugar Tests:  Hemoglobin A1c: 6.2 via POCT resulted on 02/01/2014      Readiness to Change:   How important is your health to you? 10  How confident are you in working to improve your health? 9  How ready are you to change to improve your health? 10  Total Score: 10    Care Plan:   02/01/2014 4:58 PM (EST) (1)  Problem: Patient states she is eating too much chocolate  Long Term Goal Limit chocolate consumption to once every 2 weeks. Hide the chocolate so it is out of sight.     Assessment/Plan:  Patient is a 48 year old female with DM2. A1C today is 6.2% and is meeting goal of less than 7%. Weight is stable from last visit. BMI is 31. Patient is undergoing physical therapy for back pain and is not exercising currently. She is doing the exercises that her physical therapist has prescribed for  her. I encouraged her to get back into her habit of using the elliptical machine at the employee gym once she is cleared for regular exercise. I also suggested to patient that she can walk for exercise in the mean time.     Patient has cut back on portion sizes since seeing the RD. She admits to indulging in sweets and chocolate. As the holidays are approaching I discussed with patient strategies to avoid overeating.     I will fax A1C results to Dr. Sherilyn Cooter office.     Follow up with patient in 3 months.     Goals for Next Visit-     1. Limit chocolate consumption to once every 2 weeks. Hide the chocolate so it is out of sight.  2. Exercise- continue doing physical therapy exercises. Once you are cleared for regular physical  activity aim to use the elliptical machine twice a week for 30 minutes.     Next appointment to see me is Monday January 25th at 4:15 PM

## 2014-02-02 ENCOUNTER — Ambulatory Visit: Payer: 59

## 2014-02-02 DIAGNOSIS — Z5189 Encounter for other specified aftercare: Secondary | ICD-10-CM | POA: Diagnosis not present

## 2014-02-04 ENCOUNTER — Ambulatory Visit: Payer: 59

## 2014-02-04 DIAGNOSIS — Z5189 Encounter for other specified aftercare: Secondary | ICD-10-CM | POA: Diagnosis not present

## 2014-02-15 ENCOUNTER — Encounter: Payer: Self-pay | Admitting: Gynecology

## 2014-02-15 ENCOUNTER — Ambulatory Visit: Payer: 59 | Attending: Internal Medicine | Admitting: Physical Therapy

## 2014-02-15 ENCOUNTER — Encounter: Payer: Self-pay | Admitting: Physical Therapy

## 2014-02-15 ENCOUNTER — Ambulatory Visit (INDEPENDENT_AMBULATORY_CARE_PROVIDER_SITE_OTHER): Payer: 59

## 2014-02-15 ENCOUNTER — Ambulatory Visit (INDEPENDENT_AMBULATORY_CARE_PROVIDER_SITE_OTHER): Payer: 59 | Admitting: Gynecology

## 2014-02-15 ENCOUNTER — Other Ambulatory Visit: Payer: Self-pay | Admitting: Gynecology

## 2014-02-15 VITALS — BP 132/88

## 2014-02-15 DIAGNOSIS — E119 Type 2 diabetes mellitus without complications: Secondary | ICD-10-CM | POA: Diagnosis not present

## 2014-02-15 DIAGNOSIS — I10 Essential (primary) hypertension: Secondary | ICD-10-CM | POA: Insufficient documentation

## 2014-02-15 DIAGNOSIS — M545 Low back pain, unspecified: Secondary | ICD-10-CM

## 2014-02-15 DIAGNOSIS — Z5189 Encounter for other specified aftercare: Secondary | ICD-10-CM | POA: Insufficient documentation

## 2014-02-15 DIAGNOSIS — N7011 Chronic salpingitis: Secondary | ICD-10-CM

## 2014-02-15 DIAGNOSIS — N83209 Unspecified ovarian cyst, unspecified side: Secondary | ICD-10-CM

## 2014-02-15 DIAGNOSIS — M533 Sacrococcygeal disorders, not elsewhere classified: Secondary | ICD-10-CM | POA: Diagnosis not present

## 2014-02-15 DIAGNOSIS — N8320 Unspecified ovarian cysts: Secondary | ICD-10-CM

## 2014-02-15 DIAGNOSIS — J45909 Unspecified asthma, uncomplicated: Secondary | ICD-10-CM | POA: Insufficient documentation

## 2014-02-15 DIAGNOSIS — N83202 Unspecified ovarian cyst, left side: Secondary | ICD-10-CM

## 2014-02-15 NOTE — Therapy (Signed)
Physical Therapy Treatment  Patient Details  Name: Alicia Gray MRN: [id number] Date of Birth: 1966/03/10  Encounter Date: 02/15/2014      PT End of Session - 02/15/14 1507    Visit Number 6   Number of Visits 16   Date for PT Re-Evaluation 03/11/14   PT Start Time 7824   PT Stop Time 1520   PT Time Calculation (min) 58 min   Activity Tolerance Patient tolerated treatment well      Past Medical History  Diagnosis Date  . Anemia   . Hyperlipidemia   . Diabetes mellitus     type 11  . GERD (gastroesophageal reflux disease)     Past Surgical History  Procedure Laterality Date  . Abdominal surgery      myomectomy  . Abdominal hysterectomy      TAH    LMP 03/04/2001  Visit Diagnosis:  Midline low back pain without sciatica          [facility] Adult PT Treatment/Exercise - 02/15/14 1429    Exercises   Exercises Lumbar   Lumbar Exercises: Aerobic   Elliptical Incline 4, Resistance 6 x 8 min   Lumbar Exercises: Prone   Single Arm Raise Right;Left;10 reps;2 seconds   Straight Leg Raise 10 reps;2 seconds   Opposite Arm/Leg Raise Right arm/Left leg;Left arm/Right leg;10 reps;2 seconds   Opposite Arm/Leg Raise Limitations min cues to maintain neutral spine   Lumbar Exercises: Quadruped   Single Arm Raise 10 reps;Left;Right;2 seconds   Straight Leg Raise 10 reps;2 seconds   Straight Leg Raises Limitations mod cues for neutral spine   Plank 5 x 10 sec hold with min cues for technique   Modalities   Modalities Electrical Stimulation;Moist Heat   Moist Heat Therapy   Number Minutes Moist Heat 15 Minutes   Moist Heat Location Other (comment)  low back   Electrical Stimulation   Electrical Stimulation Location low back   Electrical Stimulation Action IFC   Electrical Stimulation Parameters per machine   Electrical Stimulation Goals Pain          Education - 02/15/14 1506    Education provided Yes   Education Details exercises   Education Details Patient   Methods Explanation;Tactile cues;Verbal cues   Comprehension Returned demonstration;Verbal cues required;Tactile cues required          PT Short Term Goals - 02/15/14 1510    PT SHORT TERM GOAL #1   Title report pain decrease to < 3/10 at rest   Time 4   Period Weeks   Status On-going   PT SHORT TERM GOAL #2   Title tolerate sitting > 30 min without increase in pain   Time 4   Period Weeks   Status Achieved          PT Long Term Goals - 02/15/14 1510    PT LONG TERM GOAL #1   Title demonstrate and/or verbalize techniques to reduce the risk of reinjury to include info on posture, body mechanics, and lifting   Time 8   Period Weeks   Status On-going   PT LONG TERM GOAL #2   Title independent with advanced HEP   Time 8   Period Weeks   Status On-going   PT LONG TERM GOAL #3   Title report pain decrease to < 3/10 with activity   Time 8   Period Weeks   Status On-going   PT LONG TERM GOAL #4   Title tolerate standing for >  30 min without increase in pain   Time 8   Period Weeks   Status On-going   PT LONG TERM GOAL #5   Title report ability go get into/out of car without difficulty   Time 8   Period Weeks   Status On-going          Plan - 02/15/14 1507    Clinical Impression Statement Pt continues to report decreased pain, 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     Pt will benefit from skilled therapeutic intervention in order to improve on the following deficits Pain;Postural dysfunction;Impaired perceived functional ability;Decreased strength;Decreased mobility;Decreased endurance;Decreased activity tolerance   Rehab Potential Good   PT Frequency 2x / week   PT Duration 8 weeks   PT Treatment/Interventions Moist Heat;Therapeutic activities;Patient/family education;ADLs/Self Care Home Management;Traction;Therapeutic exercise;Manual techniques;Ultrasound;Cryotherapy;Neuromuscular re-education;Functional mobility training;Electrical  Stimulation   PT Next Visit Plan continue strengthening and stabilization; anticipate d/c next 2-3 weeks   PT Home Exercise Plan cont as instructed   Consulted and Agree with Plan of Care Patient        Problem List Patient Active Problem List   Diagnosis Date Noted  . 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 Diabetes mellitus type II, controlled 03/05/2012  . Noninflammatory disorder of fallopian tube 03/05/2012  . HYPERLIPIDEMIA 02/04/2007  . ALLERGIC RHINITIS 02/04/2007  . GERD 02/04/2007  . COUGH, CHRONIC 02/04/2007                                            Faustino Congress K 02/15/2014, 3:24 PM

## 2014-02-15 NOTE — Progress Notes (Signed)
   Patient presented to the office today for follow-up on left ovarian cyst and also what appears to be a small left hydrosalpinx. History as follows:   Patient's ultrasound in January 2014 demonstrated the following: Absent uterus, right ovary normal. Left ovary echo-free follicle 21 x 17 x 21 mm. Continued presence of a tubular structure inferior to the left ovary measuring 16 x 7 x 15 mm which appears to be a small hydrosalpinx and has gotten smaller  Follow-up ultrasound January 2015: Uterus absent. Right ovary with thick wall corpus luteum cyst measured 15 x 13 mm. Thin wall cyst within septum measuring 22 x 22 x 23 mm avascular was noted. 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 Essentially no change from 1 year ago. No fluid in the cul-de-sac.patient with normal C1 25.  Ultrasound today: Absent uterus. Right ovarian echo-free follicle 21 x 16 mm. Left ovary no evidence of previous cyst seen on ultrasound. Continued presence of a small tubal cystic structure avascular/hydrosalpinx measured 14 x 13 mm. No fluid in the cul-de-sac.  Assessment/plan: Complete resolution of left ovarian cyst. Small hydrosalpinx unchanged for many years in fact it has gotten smaller. Patient scheduled to return next month for annual gynecological exam.

## 2014-02-18 ENCOUNTER — Encounter: Payer: 59 | Attending: Internal Medicine | Admitting: Dietician

## 2014-02-18 ENCOUNTER — Ambulatory Visit: Payer: 59 | Admitting: Physical Therapy

## 2014-02-18 VITALS — Wt 180.0 lb

## 2014-02-18 DIAGNOSIS — E119 Type 2 diabetes mellitus without complications: Secondary | ICD-10-CM | POA: Diagnosis not present

## 2014-02-18 DIAGNOSIS — Z713 Dietary counseling and surveillance: Secondary | ICD-10-CM | POA: Diagnosis not present

## 2014-02-18 DIAGNOSIS — M545 Low back pain, unspecified: Secondary | ICD-10-CM

## 2014-02-18 DIAGNOSIS — Z5189 Encounter for other specified aftercare: Secondary | ICD-10-CM | POA: Diagnosis not present

## 2014-02-18 NOTE — Therapy (Signed)
Physical Therapy Treatment  Patient Details  Name: Alicia Gray MRN: [id number] Date of Birth: 1965-08-03  Encounter Date: 02/18/2014      PT End of Session - 02/18/14 1514    Visit Number 7   Number of Visits 16   Date for PT Re-Evaluation 03/11/14   PT Start Time 1501   PT Stop Time 1549   PT Time Calculation (min) 48 min   Activity Tolerance Patient tolerated treatment well  pain 1/10 after session   Behavior During Therapy [facility] for tasks assessed/performed      Past Medical History  Diagnosis Date  . Anemia   . Hyperlipidemia   . Diabetes mellitus     type 11  . GERD (gastroesophageal reflux disease)     Past Surgical History  Procedure Laterality Date  . Abdominal surgery      myomectomy  . 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 20 min   How long can you stand comfortably? 1 hour   How long can you walk comfortably? "it's okay walking, still has a catch   Patient Stated Goals decrease pain; perform work responsibilities with limited pain   Currently in Pain? Yes   Pain Score 6    Pain Location Back   Pain Orientation Lower   Pain Type Chronic pain   Pain Onset More than a month ago   Pain Frequency Intermittent   Aggravating Factors  sitting down and standing back up   Pain Relieving Factors lying down, relaxing   Multiple Pain Sites No            [facility] Adult PT Treatment/Exercise - 02/18/14 1521    Exercises   Exercises Lumbar   Lumbar Exercises: Stretches   Active Hamstring Stretch 30 seconds;3 reps  with strap & mod cues for technique   Piriformis Stretch 3 reps;30 seconds  mod cues for technique   Lumbar Exercises: Aerobic   Tread Mill 10 min; 2.0 mph for endurance and strengthening   Modalities   Modalities Electrical Stimulation;Moist Heat   Moist Heat Therapy   Number Minutes Moist Heat 12 Minutes   Moist Heat Location Other (comment)  low back   Electrical Stimulation   Electrical Stimulation Location low back   Electrical Stimulation Action IFC   Electrical Stimulation Parameters per machine up to 12 mA   Electrical Stimulation Goals Pain          PT Education - 02/18/14 1536    Education provided No          PT Short Term Goals - 02/18/14 1538    PT SHORT TERM GOAL #1   Title report pain decrease to < 3/10 at rest   Time 4   Period Weeks   Status On-going   PT SHORT TERM GOAL #2   Title tolerate sitting > 30 min without increase in pain   Time 4   Period Weeks   Status Partially Met          PT Long Term Goals - 02/18/14 1538    PT LONG TERM GOAL #4   Title tolerate standing for > 30 min without increase in  pain   Time 8   Period Weeks   Status Achieved          Plan - 02/18/14 1537    Clinical Impression Statement Pt needs increased time with exercises due to cues for safe technique and therapeutic rest breaks.  Will continue to benefit from PT to maximize functional mobility.   Pt will benefit from skilled therapeutic intervention in order to improve on the following deficits Pain;Postural dysfunction;Impaired perceived functional ability;Decreased strength;Decreased mobility;Decreased endurance;Decreased activity tolerance   Rehab Potential Good   PT Frequency 2x / week   PT Duration 8 weeks   PT Treatment/Interventions Moist Heat;Therapeutic activities;Patient/family education;ADLs/Self Care Home Management;Traction;Therapeutic exercise;Manual techniques;Ultrasound;Cryotherapy;Neuromuscular re-education;Functional mobility training;Electrical Stimulation   PT Next Visit Plan continue strengthening and stabilization; anticipate d/c next 2-3 weeks   PT Home Exercise Plan  cont as instructed   Consulted and Agree with Plan of Care Patient        Problem List Patient Active Problem List   Diagnosis Date Noted  . 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 Diabetes mellitus type II, controlled 03/05/2012  . Noninflammatory disorder of fallopian tube 03/05/2012  . HYPERLIPIDEMIA 02/04/2007  . ALLERGIC RHINITIS 02/04/2007  . GERD 02/04/2007  . COUGH, CHRONIC 02/04/2007                                             Laureen Abrahams, PT, DPT 02/18/2014 3:50 PM

## 2014-02-18 NOTE — Progress Notes (Signed)
  Medical Nutrition Therapy:  Appt start time: 505 end time:  535  Follow up:    Alicia Gray returns today having gained a few pounds since last visit. She reports she has been eating a lot of Halloween candy. However, her back pain has improved. She has reduced added sugar in coffee by 1/3. Also trying to reduce bread and pasta. Has been cleared to exercise and plans to start doing Zumba again.     Preferred Learning Style:   No preference indicated   Learning Readiness:   Contemplating  Ready   MEDICATIONS: see list   DIETARY INTAKE:  Avoided foods include aspartame, meat.    24-hr recall:  B ( AM): packet of brown sugar and cinnamon instant oatmeal and water  Snk ( AM): chocolate chip granola bar  L ( PM): varies; salad, leftovers Snk ( PM): cheese stick D ( PM): Tbsp of peanut butter, chips, and fruit  Snk ( PM): none  Beverages: water   Usual physical activity: zumba 2x a week, walking every day for 30-45 minutes, elliptical, yardwork  Estimated energy needs: 1600-1800 calories 180-200 g carbohydrates  Progress Towards Goal(s):  In progress.   Nutritional Diagnosis:  [place]-2.2 Altered nutrition-related laboratory As related to obesity and excessive carbohydrate intake.  As evidenced by HgbA1c 6.3%.    Intervention:  Nutrition counseling provided. Recommended 2-3 carb servings per meal and 1 at snacks. Discussed vegetarian protein options. Reviewed process of blood glucose uptake by cells.  Teaching Method Utilized:  Visual Auditory   Barriers to learning/adherence to lifestyle change: food preferences  Demonstrated degree of understanding via:  Teach Back   Monitoring/Evaluation:  Dietary intake, exercise, labs, and body weight prn.

## 2014-02-18 NOTE — Patient Instructions (Addendum)
-  Start exercising! -Pre-portion M&Ms into small baggies

## 2014-02-23 NOTE — Progress Notes (Signed)
Patient ID: Alicia Gray, female   DOB: 1965/07/29, 48 y.o.   MRN: [id number] Reviewed: Agree with the documentation and management of our [place].

## 2014-03-02 ENCOUNTER — Ambulatory Visit: Payer: 59 | Admitting: Rehabilitation

## 2014-03-02 DIAGNOSIS — M545 Low back pain, unspecified: Secondary | ICD-10-CM

## 2014-03-02 DIAGNOSIS — Z5189 Encounter for other specified aftercare: Secondary | ICD-10-CM | POA: Diagnosis not present

## 2014-03-02 NOTE — Patient Instructions (Signed)
   Knee Drop  Keep pelvis stable. Without rotating hips, slowly drop knee to side, pause, return to center, bring knee across midline toward opposite hip. Feel obliques engaging. Repeat for ___10_ times each leg.  Isometric Hold With Pelvic Floor (Hook-Lying)  Lie with hips and knees bent. Slowly inhale, and then exhale. Pull navel toward spine and tighten pelvic floor. Hold for __10_ seconds. Continue to breathe in and out during hold. Rest for _10__ seconds. Repeat __10_ times. Do __2-3_ times a day.   Knee Fold  Lie on back, legs bent, arms by sides. Exhale, lifting knee to chest. Inhale, returning. Keep abdominals flat, navel to spine. Repeat __10__ times, alternating legs. Do __2__ sessions per day.   Slide one leg down to straight. Return. Be sure pelvis does not rock forward, tilt, rotate, or tip to side. Do _10__ times. Restabilize pelvis. Repeat with other leg. Do __1-2_ sets, __2_ times per day.

## 2014-03-02 NOTE — Therapy (Signed)
Physical Therapy Treatment  Patient Details  Name: Alicia Gray MRN: [id number] Date of Birth: 31-Dec-1965  Encounter Date: 03/02/2014      PT End of Session - 03/02/14 1522    Visit Number 8   Number of Visits 16   Date for PT Re-Evaluation 03/11/14   PT Start Time 0320   PT Stop Time 0400   PT Time Calculation (min) 40 min      Past Medical History  Diagnosis Date  . Anemia   . Hyperlipidemia   . Diabetes mellitus     type 11  . GERD (gastroesophageal reflux disease)     Past Surgical History  Procedure Laterality Date  . Abdominal surgery      myomectomy  . Abdominal hysterectomy      TAH    LMP 03/04/2001  Visit Diagnosis:  No diagnosis found.      Subjective Assessment - 03/02/14 1520    Symptoms steady constant catch   Currently in Pain? Yes   Pain Score 6    Pain Location Back   Pain Orientation Lower;Left   Pain Descriptors / Indicators --  catch   Pain Type Chronic pain   Pain Onset More than a month ago   Pain Frequency Constant   Aggravating Factors  standing prolonged >1hour   Pain Relieving Factors lying down, hot shower            [facility] Adult PT Treatment/Exercise - 03/02/14 1529    Lumbar Exercises: Supine   Ab Set 10 reps;5 seconds   Clam 10 reps   Heel Slides 10 reps   Bent Knee Raise 10 reps   Bridge 10 reps;5 seconds  shoulder bridge   Isometric Hip Flexion 10 reps;5 seconds   Modalities   Modalities Electrical Stimulation;Moist Heat   Moist Heat Therapy   Number Minutes Moist Heat 15 Minutes   Moist Heat Location Other (comment)  low back   Electrical Stimulation   Electrical Stimulation Location low back   Electrical Stimulation Action IFC   Electrical Stimulation Parameters default settings   Electrical Stimulation Goals Pain          PT Education - 03/02/14 1538    Education provided Yes   Person(s) Educated Patient   Methods Handout   Comprehension Verbalized understanding          PT Short Term  Goals - 03/02/14 1523    PT SHORT TERM GOAL #1   Title report pain decrease to < 3/10 at rest   Time 4   Period Weeks   Status Achieved   PT SHORT TERM GOAL #2   Title tolerate sitting > 30 min without increase in pain   Time 4   Period Weeks   Status Achieved          PT Long Term Goals - 03/02/14 1524    PT LONG TERM GOAL #1   Title demonstrate and/or verbalize techniques to reduce the risk of reinjury to include info on posture, body mechanics, and lifting   Time 8   Period Weeks   Status On-going   PT LONG TERM GOAL #2   Title independent with advanced HEP   Time 8   Period Weeks   Status On-going   PT LONG TERM GOAL #3   Title report pain decrease to < 3/10 with activity   Time 8   Period Weeks   Status On-going   PT LONG TERM GOAL #4   Title tolerate  standing for > 30 min without increase in pain   Time 8   Period Weeks   Status Achieved   PT LONG TERM GOAL #5   Title report ability go get into/out of car without difficulty   Time 8   Period Weeks   Status Achieved          Plan - 03/02/14 1526    Clinical Impression Statement All stg#s met, LTG #4 and 5 Achieved, progressing toward remaining goals   PT Next Visit Plan continue strengthening and stabilization; anticipate d/c next 2-3 weeks        Problem List Patient Active Problem List   Diagnosis Date Noted  . 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 Diabetes mellitus type II, controlled 03/05/2012  . Noninflammatory disorder of fallopian tube 03/05/2012  . HYPERLIPIDEMIA 02/04/2007  . ALLERGIC RHINITIS 02/04/2007  . GERD 02/04/2007  . COUGH, CHRONIC 02/04/2007                                              Dorene Ar, PTA 03/02/2014, 3:42 PM

## 2014-03-03 ENCOUNTER — Ambulatory Visit: Payer: 59 | Admitting: Rehabilitation

## 2014-03-03 DIAGNOSIS — M545 Low back pain, unspecified: Secondary | ICD-10-CM

## 2014-03-03 DIAGNOSIS — Z5189 Encounter for other specified aftercare: Secondary | ICD-10-CM | POA: Diagnosis not present

## 2014-03-03 NOTE — Patient Instructions (Signed)
  Bridge [employer] small of back into mat, maintain pelvic tilt, roll up one vertebrae at a time. Focus on engaging posterior hip muscles. Hold for __5__ sec. Repeat __10__ times.  Copyright  VHI. All rights reserved.   Abductor Strength: Bridge Pose (Strap)  NO STRAP Lift into bridge position, while holding pelvis level, open and close knees x10. 1-2 times per day    CKnee Flexion: Resisted (Sitting)   Sit with band under left foot and looped around ankle of supported leg. Pull unsupported leg back. Repeat __10__ times per set. Do __2__ sets per session. Do _2___ sessions per day.  http://orth.exer.us/695   Copyright  VHI. All rights reserved.     Hip Extension (Prone)   Lift left leg __6__ inches from floor, keeping knee locked. Repeat _10___ times per set. Do __1-2__ sets per session. Do __2__ sessions per day.  http://orth.exer.us/99   Copyright  VHI. All rights reserved.

## 2014-03-03 NOTE — Therapy (Signed)
Physical Therapy Treatment  Patient Details  Name: Alicia Gray MRN: [id number] Date of Birth: 26-Nov-1965  Encounter Date: 03/03/2014      PT End of Session - 03/03/14 1556    Visit Number 9   Number of Visits 16   Date for PT Re-Evaluation 03/11/14   PT Start Time 0355   PT Stop Time 0447   PT Time Calculation (min) 52 min      Past Medical History  Diagnosis Date  . Anemia   . Hyperlipidemia   . Diabetes mellitus     type 11  . GERD (gastroesophageal reflux disease)     Past Surgical History  Procedure Laterality Date  . Abdominal surgery      myomectomy  . Abdominal hysterectomy      TAH    LMP 03/04/2001  Visit Diagnosis:  Midline low back pain without sciatica      Subjective Assessment - 03/02/14 1520    Symptoms steady constant catch   Currently in Pain? Yes   Pain Score 6    Pain Location Back   Pain Orientation Lower;Left   Pain Descriptors / Indicators --  catch   Pain Type Chronic pain   Pain Onset More than a month ago   Pain Frequency Constant   Aggravating Factors  standing prolonged >1hour   Pain Relieving Factors lying down, hot shower          [facility] PT Assessment - 03/03/14 1617    Strength   Right Hip Extension 5/5   Left Hip Extension 3+/5   Right Knee Flexion 5/5   Right Knee Extension 5/5   Left Knee Flexion --  4-/5   Left Knee Extension 5/5          [facility] Adult PT Treatment/Exercise - 03/03/14 1610    Lumbar Exercises: Supine   Bridge --  shoulder bridge, also with clams, unable to march   Lumbar Exercises: Prone   Straight Leg Raise 10 reps  left hip extension AROM   Other Prone Lumbar Exercises --  h/s curl 3# x15, difficult   Knee/Hip Exercises: Seated   Other Seated Knee Exercises --  h/s curl with red band x 10   Modalities   Modalities Electrical Stimulation;Moist Heat   Moist Heat Therapy   Number Minutes Moist Heat 15 Minutes   Moist Heat Location Other (comment)  low back   Electrical  Stimulation   Electrical Stimulation Location low back   Electrical Stimulation Action IFC   Electrical Stimulation Parameters default settings   Electrical Stimulation Goals Pain          PT Education - 03/03/14 1648    Education provided Yes   Person(s) Educated Patient   Methods Explanation;Handout   Comprehension Verbalized understanding          PT Short Term Goals - 03/02/14 1523    PT SHORT TERM GOAL #1   Title report pain decrease to < 3/10 at rest   Time 4   Period Weeks   Status Achieved   PT SHORT TERM GOAL #2   Title tolerate sitting > 30 min without increase in pain   Time 4   Period Weeks   Status Achieved          PT Long Term Goals - 03/02/14 1524    PT LONG TERM GOAL #1   Title demonstrate and/or verbalize techniques to reduce the risk of reinjury to include info on posture, body mechanics, and lifting  Time 8   Period Weeks   Status On-going   PT LONG TERM GOAL #2   Title independent with advanced HEP   Time 8   Period Weeks   Status On-going   PT LONG TERM GOAL #3   Title report pain decrease to < 3/10 with activity   Time 8   Period Weeks   Status On-going   PT LONG TERM GOAL #4   Title tolerate standing for > 30 min without increase in pain   Time 8   Period Weeks   Status Achieved   PT LONG TERM GOAL #5   Title report ability go get into/out of car without difficulty   Time 8   Period Weeks   Status Achieved          Plan - 03/03/14 1641    Clinical Impression Statement left hamstring and glut max weakness   PT Next Visit Plan FOTO, continue hip and hamstring strength, core,hip stab        Problem List Patient Active Problem List   Diagnosis Date Noted  . 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 Diabetes mellitus type II, controlled 03/05/2012  . Noninflammatory disorder of fallopian tube 03/05/2012  . HYPERLIPIDEMIA 02/04/2007  . ALLERGIC RHINITIS 02/04/2007  . GERD 02/04/2007  . COUGH, CHRONIC 02/04/2007      Dorene Ar 03/03/2014, 4:51 PM

## 2014-03-09 ENCOUNTER — Ambulatory Visit: Payer: 59 | Attending: Internal Medicine | Admitting: Physical Therapy

## 2014-03-09 DIAGNOSIS — M545 Low back pain, unspecified: Secondary | ICD-10-CM

## 2014-03-09 DIAGNOSIS — E119 Type 2 diabetes mellitus without complications: Secondary | ICD-10-CM | POA: Diagnosis not present

## 2014-03-09 DIAGNOSIS — J45909 Unspecified asthma, uncomplicated: Secondary | ICD-10-CM | POA: Diagnosis not present

## 2014-03-09 DIAGNOSIS — M533 Sacrococcygeal disorders, not elsewhere classified: Secondary | ICD-10-CM | POA: Diagnosis not present

## 2014-03-09 DIAGNOSIS — Z5189 Encounter for other specified aftercare: Secondary | ICD-10-CM | POA: Insufficient documentation

## 2014-03-09 DIAGNOSIS — I10 Essential (primary) hypertension: Secondary | ICD-10-CM | POA: Diagnosis not present

## 2014-03-09 NOTE — Therapy (Signed)
Outpatient Rehabilitation [facility] [address] [place], [place], [address] Phone: [phone]   Fax:  [phone]  Physical Therapy Treatment  Patient Details  Name: Alicia Gray MRN: [id number] Date of Birth: 1966/03/26  Encounter Date: 03/09/2014      PT End of Session - 03/09/14 1707    Visit Number 10   Number of Visits 16   Date for PT Re-Evaluation 03/11/14   PT Start Time 1632   PT Stop Time 1720   PT Time Calculation (min) 48 min   Activity Tolerance Patient tolerated treatment well   Behavior During Therapy [facility] for tasks assessed/performed      Past Medical History  Diagnosis Date  . Anemia   . Hyperlipidemia   . Diabetes mellitus     type 11  . GERD (gastroesophageal reflux disease)     Past Surgical History  Procedure Laterality Date  . Abdominal surgery      myomectomy  . Abdominal hysterectomy      TAH    LMP 03/04/2001  Visit Diagnosis:  Midline low back pain without sciatica      Subjective Assessment - 03/09/14 1633    Symptoms only feels stiff and and achy today   Limitations Lifting;House hold activities;Standing;Walking;Sitting   How long can you sit comfortably? 15-20 min   How long can you walk comfortably? walking is fine   Patient Stated Goals decrease pain; perform work responsibilities with limited pain   Currently in Pain? Yes   Pain Score 6    Pain Location Back   Pain Orientation Left;Lower   Pain Descriptors / Indicators --  catch   Pain Onset More than a month ago   Pain Frequency Constant          [facility] PT Assessment - 03/09/14 1715    Observation/Other Assessments   Focus on Therapeutic Outcomes (FOTO)  52 (48% limited)          [facility] Adult PT Treatment/Exercise - 03/09/14 1634    Lumbar Exercises: Aerobic   Elliptical incline 5, resistance 5 x 5 min   Lumbar Exercises: Standing   Other Standing Lumbar Exercises sidestepping and backwards walking 40'x2 with red theraband   Lumbar  Exercises: Prone   Straight Leg Raise 15 reps  2# bil   Other Prone Lumbar Exercises hamstring curls 3# x 15 reps bil   Modalities   Modalities Electrical Stimulation;Moist Heat   Moist Heat Therapy   Number Minutes Moist Heat 15 Minutes   Moist Heat Location Other (comment)  low back   Electrical Stimulation   Electrical Stimulation Location low back   Electrical Stimulation Action IFC   Electrical Stimulation Parameters default settings   Electrical Stimulation Goals Pain          PT Education - 03/09/14 1707    Education provided Yes   Education Details home TENS unit; progress and POC and plan for d/c next visit   Person(s) Educated Patient   Methods Explanation   Comprehension Verbalized understanding          PT Short Term Goals - 03/09/14 1709    PT SHORT TERM GOAL #1   Title report pain decrease to < 3/10 at rest   Time 4   Period Weeks   Status Achieved   PT SHORT TERM GOAL #2   Title tolerate sitting > 30 min without increase in pain   Time 4   Period Weeks   Status Achieved  PT Long Term Goals - 03/09/14 1709    PT LONG TERM GOAL #1   Title demonstrate and/or verbalize techniques to reduce the risk of reinjury to include info on posture, body mechanics, and lifting   Time 8   Period Weeks   Status On-going   PT LONG TERM GOAL #2   Title independent with advanced HEP   Time 8   Period Weeks   Status On-going   PT LONG TERM GOAL #3   Title report pain decrease to < 3/10 with activity   Time 8   Period Weeks   Status On-going   PT LONG TERM GOAL #4   Title tolerate standing for > 30 min without increase in pain   Time 8   Period Weeks   Status Achieved          Plan - 03/09/14 1708    Clinical Impression Statement Pt continues to c/o catch and reports e-stim and heat most helpful.  Recommended continuation of home program and provided pt with information to purchase home TENS unit.   PT Next Visit Plan check goals and d/c next  visit   Consulted and Agree with Plan of Care Patient                               Problem List Patient Active Problem List   Diagnosis Date Noted  . 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 Diabetes mellitus type II, controlled 03/05/2012  . Noninflammatory disorder of fallopian tube 03/05/2012  . HYPERLIPIDEMIA 02/04/2007  . ALLERGIC RHINITIS 02/04/2007  . GERD 02/04/2007  . COUGH, CHRONIC 02/04/2007    Laureen Abrahams, PT, DPT 03/09/2014 5:23 PM [address] [phone] (office) [phone] (fax)

## 2014-03-11 ENCOUNTER — Other Ambulatory Visit (HOSPITAL_COMMUNITY)
Admission: RE | Admit: 2014-03-11 | Discharge: 2014-03-11 | Disposition: A | Discharge status: Home / Self Care | Payer: 59 | Source: Ambulatory Visit | Attending: Gynecology | Admitting: Gynecology

## 2014-03-11 ENCOUNTER — Ambulatory Visit: Payer: 59 | Admitting: Physical Therapy

## 2014-03-11 ENCOUNTER — Ambulatory Visit (INDEPENDENT_AMBULATORY_CARE_PROVIDER_SITE_OTHER): Payer: 59 | Admitting: Gynecology

## 2014-03-11 ENCOUNTER — Encounter: Payer: Self-pay | Admitting: Gynecology

## 2014-03-11 VITALS — BP 124/80 | Ht 61.25 in | Wt 180.0 lb

## 2014-03-11 DIAGNOSIS — M545 Low back pain, unspecified: Secondary | ICD-10-CM

## 2014-03-11 DIAGNOSIS — Z1151 Encounter for screening for human papillomavirus (HPV): Secondary | ICD-10-CM | POA: Insufficient documentation

## 2014-03-11 DIAGNOSIS — R635 Abnormal weight gain: Secondary | ICD-10-CM

## 2014-03-11 DIAGNOSIS — Z01419 Encounter for gynecological examination (general) (routine) without abnormal findings: Secondary | ICD-10-CM

## 2014-03-11 DIAGNOSIS — Z5189 Encounter for other specified aftercare: Secondary | ICD-10-CM | POA: Diagnosis not present

## 2014-03-11 NOTE — Therapy (Signed)
Outpatient Rehabilitation [facility] [address] [place], [place], [address] Phone: [phone]   Fax:  [phone]  Physical Therapy Treatment  Patient Details  Name: Alicia Gray MRN: [id number] Date of Birth: 1965-06-21  Encounter Date: 03/11/2014      PT End of Session - 03/11/14 1659    Visit Number 11   Number of Visits 16   Date for PT Re-Evaluation 03/11/14   PT Start Time 1632   PT Stop Time 1717   PT Time Calculation (min) 45 min   Activity Tolerance Patient tolerated treatment well   Behavior During Therapy [facility] for tasks assessed/performed      Past Medical History  Diagnosis Date  . Anemia   . Hyperlipidemia   . Diabetes mellitus     type 11  . GERD (gastroesophageal reflux disease)     Past Surgical History  Procedure Laterality Date  . Abdominal surgery      myomectomy  . Abdominal hysterectomy      TAH    LMP 03/04/2001  Visit Diagnosis:  Midline low back pain without sciatica      Subjective Assessment - 03/11/14 1634    Symptoms back is "ok"   Limitations Lifting;House hold activities;Standing;Walking;Sitting   How long can you sit comfortably? 1 hour   How long can you stand comfortably? 1 hour   Patient Stated Goals decrease pain; perform work responsibilities with limited pain   Currently in Pain? Yes   Pain Score 3    Pain Location Back   Pain Orientation Left;Lower   Pain Descriptors / Indicators --  catch   Pain Type Chronic pain   Pain Onset More than a month ago   Pain Frequency Constant   Aggravating Factors  standing   Pain Relieving Factors lying down; hot shower            [facility] Adult PT Treatment/Exercise - 03/11/14 1640    Lumbar Exercises: Supine   Bridge 10 reps;5 seconds   Bridge Limitations x 10 with hip abdct   Lumbar Exercises: Prone   Straight Leg Raise 10 reps   Modalities   Modalities Electrical Stimulation;Moist Heat   Moist Heat Therapy   Number Minutes Moist Heat 15 Minutes   Moist Heat Location Other (comment)  low back   Electrical Stimulation   Electrical Stimulation Location low back   Electrical Stimulation Action IFC   Electrical Stimulation Parameters default settings to tolerance   Electrical Stimulation Goals Pain          PT Education - 03/11/14 1658    Education provided Yes   Education Details posture/body Regulatory affairs officer) Educated Patient   Methods Explanation;Handout   Comprehension Verbalized understanding          PT Short Term Goals - 03/11/14 1700    PT SHORT TERM GOAL #1   Title report pain decrease to < 3/10 at rest   Time 4   Period Weeks   Status Achieved   PT SHORT TERM GOAL #2   Title tolerate sitting > 30 min without increase in pain   Time 4   Period Weeks   PT SHORT TERM GOAL #3   Status Achieved          PT Long Term Goals - 03/11/14 1700    PT LONG TERM GOAL #1   Title demonstrate and/or verbalize techniques to reduce the risk of reinjury to include info on posture, body mechanics, and lifting   Time 8  Period Weeks   Status Achieved   PT LONG TERM GOAL #2   Title independent with advanced HEP   Time 8   Period Weeks   Status Achieved   PT LONG TERM GOAL #3   Title report pain decrease to < 3/10 with activity   Time 8   Period Weeks   Status Not Met   PT LONG TERM GOAL #4   Title tolerate standing for > 30 min without increase in pain   Time 8   Period Weeks   Status Achieved   PT LONG TERM GOAL #5   Title report ability go get into/out of car without difficulty   Time 8   Period Weeks   Status Achieved          Plan - 03/11/14 1659    Clinical Impression Statement Pt ready for d/c and transistion to home program.   PT Next Visit Plan d/c PT                               Problem List Patient Active Problem List   Diagnosis Date Noted  . 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 Diabetes mellitus type II, controlled 03/05/2012  . Noninflammatory disorder  of fallopian tube 03/05/2012  . HYPERLIPIDEMIA 02/04/2007  . ALLERGIC RHINITIS 02/04/2007  . GERD 02/04/2007  . COUGH, CHRONIC 02/04/2007   Laureen Abrahams, PT, DPT 03/11/2014 5:17 PM  [facility] 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  Educated on posture while driving and safe ways to carry bag without putting additional stress on back.   Education / Equipment: Posture/body Patent examiner; HEP; home TENS unit  Plan: Patient agrees to discharge.  Patient goals were partially met. Patient is being discharged due to meeting the stated rehab goals.  ?????       Laureen Abrahams, PT, DPT 03/11/2014 5:17 PM  [facility] [address] [place] [address]  [phone] (office) [phone] (fax)

## 2014-03-11 NOTE — Patient Instructions (Signed)

## 2014-03-11 NOTE — Patient Instructions (Signed)

## 2014-03-11 NOTE — Addendum Note (Signed)
Addended by: Thurnell Garbe A on: 03/11/2014 10:16 AM   Modules accepted: Orders, SmartSet

## 2014-03-11 NOTE — Progress Notes (Signed)
Alicia Gray Feb 11, 1966 [id number]   History:    48 y.o.  for annual gyn exam with no complaints today.Patient has history of type 2 diabetes as well as hypercholesterolemia and is being followed by her primary physician Dr. Deforest Hoyles who has been doing her blood work. Patient states that her vaccines are all up-to-date. 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 Patient with known left hydrosalpinx.  Last ultrasound one year ago demonstrated the following: Ultrasound today: Absent uterus. Right ovarian echo-free follicle 21 x 16 mm. Left ovary no evidence of previous cyst seen on ultrasound. Continued presence of a small tubal cystic structure avascular/hydrosalpinx measured 14 x 13 mm. No fluid in the cul-de-sac.  Patient had normal CA1 25 in 2012 in July 2015. Patient with no past history of abnormal Pap smears.   Past medical history,surgical history, family history and social history were all reviewed and documented in the EPIC chart.  Gynecologic History Patient's last menstrual period was 03/04/2001. Contraception: status post hysterectomy Last Pap: 2012. Results were: normal Last mammogram: 2015. Results were: normal  Obstetric History OB History  Gravida Para Term Preterm AB SAB TAB Ectopic Multiple Living  3 0   3 3    0    # Outcome Date GA Lbr Len/2nd Weight Sex Delivery Anes PTL Lv  3 SAB           2 SAB           1 SAB                ROS: A ROS was performed and pertinent positives and negatives are included in the history.  GENERAL: No fevers or chills. HEENT: No change in vision, no earache, sore throat or sinus congestion. NECK: No pain or stiffness. CARDIOVASCULAR: No chest pain or pressure. No palpitations. PULMONARY: No shortness of breath, cough or wheeze. GASTROINTESTINAL: No abdominal pain, nausea, vomiting or diarrhea, melena or bright red blood per rectum.  GENITOURINARY: No urinary frequency, urgency, hesitancy or dysuria. MUSCULOSKELETAL: No joint or muscle pain, no back pain, no recent trauma. DERMATOLOGIC: No rash, no itching, no lesions. ENDOCRINE: No polyuria, polydipsia, no heat or cold intolerance. No recent change in weight. HEMATOLOGICAL: No anemia or easy bruising or bleeding. NEUROLOGIC: No headache, seizures, numbness, tingling or weakness. PSYCHIATRIC: No depression, no loss of interest in normal activity or change in sleep pattern.     Exam: chaperone present  BP 124/80 mmHg  Ht 5' 1.25" (1.556 m)  Wt 180 lb (81.647 kg)  BMI 33.72 kg/m2  LMP 03/04/2001  Body mass index is 33.72 kg/(m^2).  General appearance : Well developed well nourished female. No acute distress HEENT: Neck supple, trachea midline, no carotid bruits, no thyroidmegaly Lungs: Clear to auscultation, no rhonchi or wheezes, or rib retractions  Heart: Regular rate and rhythm, no murmurs or gallops Breast:Examined in sitting and supine position were symmetrical in appearance, no palpable masses or tenderness,  no skin retraction, no nipple inversion, no nipple discharge, no skin discoloration, no axillary or supraclavicular lymphadenopathy Abdomen: no palpable masses or tenderness, no rebound or guarding Extremities: no edema or skin discoloration or tenderness  Pelvic:  Bartholin, Urethra, Skene Glands: Within normal limits             Vagina: No gross lesions or discharge  Cervix: Absent  Uterus  absent  Adnexa  Without masses or tenderness  Anus and  perineum  normal   Rectovaginal  normal sphincter tone without palpated masses or tenderness             Hemoccult not indicated     Assessment/Plan:  48 y.o. female for annual exam doing well otherwise. She will continue to follow with her PCP who is doing her blood work and treat her for type 2 diabetes. Pap smear was done today. We did discuss the new guidelines. She was reminded do her monthly breast exam.  Discussed importance of calcium vitamin D and regular exercise for osteoporosis prevention.   Terrance Mass MD, 9:52 AM 03/11/2014

## 2014-03-12 LAB — CYTOLOGY - PAP

## 2014-04-21 ENCOUNTER — Other Ambulatory Visit (HOSPITAL_COMMUNITY): Payer: Self-pay | Admitting: Orthopedic Surgery

## 2014-04-21 DIAGNOSIS — M545 Low back pain: Secondary | ICD-10-CM

## 2014-05-03 ENCOUNTER — Ambulatory Visit (INDEPENDENT_AMBULATORY_CARE_PROVIDER_SITE_OTHER): Payer: Self-pay | Admitting: Family Medicine

## 2014-05-03 VITALS — BP 130/76 | Wt 176.4 lb

## 2014-05-03 DIAGNOSIS — E119 Type 2 diabetes mellitus without complications: Secondary | ICD-10-CM

## 2014-05-03 NOTE — Assessment & Plan Note (Signed)
Subjective:  Patient presents today for 3 month diabetes follow-up as part of the employer-sponsored Link to Wellness program. Current diabetes regimen includes metformin 500 mg once daily. Patient also continues on daily ARB and statin. No med changes or major health changes at this time.  Patient states that she has been exercising daily (M-F) for at least 90 minutes. This is an improvement from last visit with me.  Last visit with Deforest Hoyles was December 6th and A1C was 6.7%. Only medication change was Losartan 25 mg was changed to once daily. Next appointment with Deforest Hoyles is pending in June 2016.  She has an MRI of her back this week on the 27th. She has been having back pain and sometimes numbness in her arm.   Disease Assessments:  Diabetes: Sees Diabetes provider 2 times per year; MD managing Diabetes Hussein; checks feet daily; takes medications as prescribed; does not take an aspirin a day; uses glucometer; Type of Diabetes: Pre-Diabetes; hypoglycemia frequency rarely; checks blood glucose Never;\   Other Diabetes History:  She has not been checking blood sugar for the past few months because she lost her meter. A1C in December was 6.7%. She denies feeling any hypoglycemia. Gave patient a new True Result meter and asked her to start checking fasting blood sugar once daily.   Tobacco Assessment: Smoking Status: Never smoker; Last Reviewed: 05/03/2014       Physical Activity- She has graduated from physical therapy (December 1st). She states that it helped with the back pain. She is still doing the PT exercises. In addition she is going to the gym at [facility] for 90 minutes Mon-Fri. She is doing the treadmill for 20-30 minutes, bicycle as long as she can and also the elliptical machine. This is an improvement from last time.  Nutrition-  She saw the RD in November again. The RD said that she should continue what she was doing. She admits to having trouble with eating chocolates and sweets over  the holidays. She states that she ate a 12 ounce bag of peanut M&Ms over the weekend. Weight is stable since last appointment- up 1 pound.    Preventive Care:    Hemoglobin A1c: 03/15/2014 via Dr. Deforest Hoyles 6.7    Dilated Eye Exam: 04/27/2014  Flu vaccine: 01/07/2014  Foot Exam: 04/08/2012  Other Preventive Care Notes:  Last dental visit February 2016      Overall Health Assessments:  Vision:  Dilated Eye Exam: 04/27/2014   Vital Signs:  05/03/2014 5:37 PM (EST)Blood Pressure 130 / 76 mm/HgBMI 32.0; Height 5 ft 2.25 in; Weight 176.4 lbs   Testing:  Blood Sugar Tests: Hemoglobin A1c: 6.7 via Dr. Deforest Hoyles resulted on 03/15/2014          Care Planning:  Learning Preference Assessment:  Learner: Patient  Readiness to Learn Barriers: Fatigue  Teaching Method: Explanation  Evaluation of Learning: Needs review and assistance  Readiness to Change:  How important is your health to you? 10  How confident are you in working to improve your health? 9  How ready are you to change to improve your health? 10  Total Score: 10  Care Plan:  05/03/2014 5:37 PM (EST) (1)  Problem: Patient states she is eating too much chocolate  Role: Clinical Pharmacist  Long Term Goal Stop buying chocolate.  Date Started: 05/03/2014  No Goal Met patient admits to eating frequent chocolate- even a 12 ounce bag over a weekend. changed goal to stop buying chocolate.     Assessment/Plan:  Patient is a 49 year old female with DM2. Most recent A1C was 6.7% and is meeting goal of less than 7%. Weight is stable since last visit. She has dramatically increased physical activity since her last visit with me and is going to the gym five days weekly for 90 minutes. I congratulated patient on her success and encouraged her to continue. She reports that she feels better and her clothes are fitting differently. She hasn't been checking blood sugar so I don't know what impact it has had on glycemic control, but I estimate  it has improved. She is frustrated that she hasn't lost weight, but upon further discussion she admitted that she is frequently eating chocolate, especially M&Ms. I suggested that patient either stop buying chocolate or just eat 1 fun size chocolate daily. Patient opted to cut out chocolate entirely.  Follow up with patient in 3 months.  Goals for Next Visit- 1. Stop buying M&Ms. If your husband brings them home, politely ask him to take them out of your house. 2. Check blood sugar in the morning before you eat. Aim for checking every day. 3. Continue physical activity. Celebrate your non scale victories!! You are doing an awesome job. Next appointment to see me is April 25th at 4:15 PM.

## 2014-05-04 ENCOUNTER — Ambulatory Visit: Payer: 59

## 2014-05-05 ENCOUNTER — Ambulatory Visit (HOSPITAL_COMMUNITY)
Admission: RE | Admit: 2014-05-05 | Discharge: 2014-05-05 | Disposition: A | Discharge status: Home / Self Care | Payer: 59 | Source: Ambulatory Visit | Attending: Orthopedic Surgery | Admitting: Orthopedic Surgery

## 2014-05-05 DIAGNOSIS — M5127 Other intervertebral disc displacement, lumbosacral region: Secondary | ICD-10-CM | POA: Insufficient documentation

## 2014-05-05 DIAGNOSIS — M545 Low back pain: Secondary | ICD-10-CM

## 2014-05-05 DIAGNOSIS — M5126 Other intervertebral disc displacement, lumbar region: Secondary | ICD-10-CM | POA: Insufficient documentation

## 2014-05-05 DIAGNOSIS — M4806 Spinal stenosis, lumbar region: Secondary | ICD-10-CM | POA: Diagnosis not present

## 2014-05-05 DIAGNOSIS — M8938 Hypertrophy of bone, other site: Secondary | ICD-10-CM | POA: Diagnosis not present

## 2014-06-15 NOTE — Progress Notes (Signed)
Patient ID: Alicia Gray, female   DOB: 1965/09/08, 49 y.o.   MRN: [id number] ATTENDING PHYSICIAN NOTE: I have reviewed the chart and agree with the plan as detailed above. Dorcas Mcmurray MD Pager [phone]

## 2014-08-02 ENCOUNTER — Ambulatory Visit (INDEPENDENT_AMBULATORY_CARE_PROVIDER_SITE_OTHER): Payer: Self-pay | Admitting: Family Medicine

## 2014-08-02 ENCOUNTER — Ambulatory Visit: Payer: Self-pay | Admitting: Pharmacist

## 2014-08-02 ENCOUNTER — Ambulatory Visit: Payer: 59 | Admitting: Pharmacist

## 2014-08-02 VITALS — BP 128/78 | Ht 62.0 in | Wt 179.2 lb

## 2014-08-02 DIAGNOSIS — E119 Type 2 diabetes mellitus without complications: Secondary | ICD-10-CM

## 2014-08-02 LAB — POCT GLYCOSYLATED HEMOGLOBIN (HGB A1C): Hemoglobin A1C: 6.6

## 2014-08-02 NOTE — Progress Notes (Signed)
Subjective:  Patient presents today for 3 month diabetes follow-up as part of the employer-sponsored Link to Wellness program.  Current diabetes regimen includes metformin 500 mg daily.  Patient also continues on daily ARB and statin.  Most recent MD follow-up was with Dr. Deforest Hoyles in January. Patient has a pending appt for July. She requested that we check A1C today for her. No med changes or major health changes at this time.    Assessment/Plan:  Patient is a 49  yo female/female with DM 2. Most recent A1C was 6.6  % which is at goal/exceeding goal of less than 7%. Weight is increased from last visit with me by 3 pounds. She thinks this is because she is eating more breads and pasta.   CBG Review: She states she is checking once daily. She did not bring her meter but reports it is usually around 87. Denies hypoglycemia.   Lifestyle improvements:  Physical Activity-  She is walking four days a week for 30 minutes.  Nutrition-  She has cut back on concentrated sweets (m&ms) but has replaced with more breads and pasta.   Reviewed patient's breakfast meal. She is usually eating a biscuit with butter and a cup of cranberry juice. We looked up the nutrition information for this and she is getting over 60 grams of carbohydrates. I recommended to patient to add more protein and fiber in the morning and to cut back on juice intake. She is interested in oatmeal and we found one that she wants to try (quaker oatmeal weight control) with 7 grams of protein and 6 grams of fiber.   I will send A1C results to Dr. Deforest Hoyles.  Follow up with me in 3 months.    Goals for Next Visit:  1. Cut back on how often you eat bread and pasta (specifically biscuits). Aim for 3-4 servings (45-60 grams of carbohydrates) with each meal.  2. For breakfast- try the weight control instant oatmeal. It has more protein and fiber than the other instant oatmeal and less sugar. This will help you to feel full in the morning. Cut  back cranberry juice to 1/2 cup or nothing.  3. Keep checking your blood sugar once a day and blood pressure.     Next appointment to see me is: Friday July 29th at 4:30 PM.    Alicia Gray, PharmD, BCPS, CDE Alicia Gray to Alicia Gray Coordinator [phone]

## 2014-08-10 NOTE — Progress Notes (Signed)
ATTENDING PHYSICIAN NOTE: I have reviewed the chart and agree with the plan as detailed above. Yarelli Decelles MD Pager [phone]  

## 2014-09-03 ENCOUNTER — Encounter: Payer: Self-pay | Admitting: Pharmacist

## 2014-10-04 ENCOUNTER — Other Ambulatory Visit: Payer: Self-pay

## 2014-11-05 ENCOUNTER — Ambulatory Visit: Payer: Self-pay | Admitting: Pharmacist

## 2014-11-05 ENCOUNTER — Ambulatory Visit (INDEPENDENT_AMBULATORY_CARE_PROVIDER_SITE_OTHER): Payer: Self-pay | Admitting: Family Medicine

## 2014-11-05 VITALS — BP 134/80 | Ht 62.0 in | Wt 178.0 lb

## 2014-11-05 DIAGNOSIS — E119 Type 2 diabetes mellitus without complications: Secondary | ICD-10-CM

## 2014-11-05 NOTE — Progress Notes (Signed)
Subjective:  Patient presents today for 3 month diabetes follow-up as part of the employer-sponsored Link to Wellness program.  Current diabetes regimen includes metformin 500 mg once daily.  Patient also continues on daily ARB and statin.  No med changes or major health changes at this time.   Last appointment with Dr. Deforest Hoyles was on the 18th of this month. A1C at that visit was 6.7%.   Assessment/Plan:  Patient is a 49 y.o. female with DM 2. Most recent A1C was   6.7% which is at goal/exceeding goal of less than 7%. Weight is stable from last visit with me.   CBG Review: Checking CBG once daily fasting in the morning. Patient did not bring meter with her to the appointment. Self reported fasting average around 97. Denies hypoglycemia.   Lifestyle improvements:  Physical Activity-  With the hotter weather patient has not been exercising.    Nutrition-  Patient does not eat meat. Based on 24 hour recall her diet is mainly carbohydrates. She is not getting much protein during the day.   She states that she has been eating more bread lately. She is also eating fresh fruits and vegetables. She is drinking juice a few times a week.   She has done better with abstaining from M&Ms and other candy. She is not eating them during the week.   Reviewed with patient how to read a nutrition label and how to determine portion sizes with carbohydrates and especially pasta.    Follow up with me in 3 months.    Goals for Next Visit:  1. Once the weather cools down to less than 90 degrees start using the new gym at [facility]. Aim for 3 days a week for 30 minutes.  2. Weight loss- Switch out the popcorn for a 100 calorie bag of popcorn with lunch instead of the entire standard size bag. Aim for 45-60 grams of carbohydrates with each meal.  3. Try to eat more protein during the day- ideas are black beans, cheese, hard boiled eggs.     Next appointment to see me is: Friday October 28th at 4:30  PM.    Jinny Blossom D. Donneta Romberg, PharmD, BCPS, CDE Norm [facility] to [place] Coordinator [phone]

## 2014-11-22 NOTE — Progress Notes (Signed)
Patient ID: BIANCO CANGE, female   DOB: 1965-11-27, 49 y.o.   MRN: [id number] ATTENDING PHYSICIAN NOTE: I have reviewed the chart and agree with the plan as detailed above. Dorcas Mcmurray MD Pager [phone]

## 2015-01-28 ENCOUNTER — Encounter: Payer: Self-pay | Admitting: Gynecology

## 2015-02-04 ENCOUNTER — Ambulatory Visit (INDEPENDENT_AMBULATORY_CARE_PROVIDER_SITE_OTHER): Payer: Self-pay | Admitting: Family Medicine

## 2015-02-04 ENCOUNTER — Encounter: Payer: Self-pay | Admitting: Pharmacist

## 2015-02-04 VITALS — BP 116/72 | Ht 62.25 in | Wt 181.1 lb

## 2015-02-04 DIAGNOSIS — E119 Type 2 diabetes mellitus without complications: Secondary | ICD-10-CM

## 2015-02-04 NOTE — Progress Notes (Signed)
Subjective:  Patient presents today for 3 month diabetes follow-up as part of the employer-sponsored Link to Wellness program.  Current diabetes regimen includes metformin 500 mg daily.  Patient also continues on daily ARB and statin.  No med changes or major health changes at this time.   She hasn't been taking atorvastatin. She only takes Losartan when her SBP>140 (she is checking BP at home once daily). She estimates that she takes losartan 4-5 days a week.   I contacted Dr. Glenna Durand office for a refill request on Atorvastatin.   Last visit with Dr. Lysle Rubens was in July. She is unsure what her A1C was at that visit. Next visit with him is in January.   Assessment:  Diabetes: A1C today was 6.8  % which is at goal of less than 7%. Weight is increased  from last visit with me.    CBG Review: Patient did not bring meter with her to the appointment.   She reports that she is checking her blood sugar 1-2 times daily. She is checking fasting and then again around bedtime.   Per patient report- fasting CBG is 87-92 (highest she can remember is 130).   Denies hyopglycemia.    Lifestyle improvements:  Physical Activity-  Patient reports that she is not exercising currently. She reports that she is walking up the stairs at work.   Previously she was working out at the [employer]. She stopped because of the heat of the summer and her office moved to [employer].   She expresses a desire to restart physical activity.   Nutrition-  No longer eating popcorn (this was one of her goals last time was to decrease portion sizes of popcorn).   For protein during the day she is eating peanut butter, egg at breakfast, beans at lunch. She has also been eating soup for lunch.   She was counting carbohydrates for a little while but then stopped. She states she is eating the same amount of food/portion sizes are about the same.   She has not been eating M&Ms (this has been a struggle for her in the past).  Encouraged patient to abstain from Halloween candy.    Follow up with me in 3 months. I will send A1C results to Dr. Lysle Rubens.    Plan/Goals for Next Visit:  1. Start exercising again. The gyms at the [facility] are on the first floor and the fourth floor. If you can't get in with your badge, call [employer] [phone]). Try to get back to three days a week for 30 minutes.  2. Stay away from the candy. Do not go to a drug store the week of November 1st. If you aren't there, you can't buy it. If you have to get Halloween candy, leave it at work and make a bag last until Christmas.     Next appointment to see me is: Friday January 27th at 4 PM.    Jinny Blossom D. Donneta Romberg, PharmD, BCPS, CDE [facility] to [place] Coordinator [phone]

## 2015-02-15 NOTE — Progress Notes (Signed)
I have reviewed this pharmacist's note and agree  

## 2015-03-14 ENCOUNTER — Ambulatory Visit (INDEPENDENT_AMBULATORY_CARE_PROVIDER_SITE_OTHER): Payer: 59 | Admitting: Gynecology

## 2015-03-14 ENCOUNTER — Encounter: Payer: Self-pay | Admitting: Gynecology

## 2015-03-14 VITALS — BP 128/84 | Ht 61.0 in | Wt 170.0 lb

## 2015-03-14 DIAGNOSIS — Z01419 Encounter for gynecological examination (general) (routine) without abnormal findings: Secondary | ICD-10-CM

## 2015-03-14 NOTE — Progress Notes (Signed)
Alicia Gray November 16, 1965 [id number]   History:    49 y.o.  for annual gyn exam who presents with no complaints today. Patient has a history of type 2 diabetes as well as hypercholesterolemia and is being followed by her primary physician Dr. Deforest Hoyles who has been doing her blood work. Patient states that her vaccines are all up-to-date. 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 Patient with known left hydrosalpinx.  Last ultrasound one year ago demonstrated the following: Ultrasound today: Absent uterus. Right ovarian echo-free follicle 21 x 16 mm. Left ovary no evidence of previous cyst seen on ultrasound. Continued presence of a small tubal cystic structure avascular/hydrosalpinx measured 14 x 13 mm. No fluid in the cul-de-sac.  Patient had normal CA1 25 in 2012 in July 2015. Patient with no past history of abnormal Pap smears.   Past medical history,surgical history, family history and social history were all reviewed and documented in the [facility] chart.  Gynecologic History Patient's last menstrual period was 03/04/2001. Contraception: status post hysterectomy Last Pap: 2015. Results were: normal Last mammogram: 2016. Results were: normal  Obstetric History OB History  Gravida Para Term Preterm AB SAB TAB Ectopic Multiple Living  3 0   3 3    0    # Outcome Date GA Lbr Len/2nd Weight Sex Delivery Anes PTL Lv  3 SAB           2 SAB           1 SAB                ROS: A ROS was performed and pertinent positives and negatives are included in the history.  GENERAL: No fevers or chills. HEENT: No change in vision, no earache, sore throat or sinus congestion. NECK: No pain or stiffness. CARDIOVASCULAR: No chest pain or pressure. No palpitations. PULMONARY: No shortness of breath, cough or wheeze. GASTROINTESTINAL: No abdominal pain, nausea, vomiting or diarrhea, melena or bright red  blood per rectum. GENITOURINARY: No urinary frequency, urgency, hesitancy or dysuria. MUSCULOSKELETAL: No joint or muscle pain, no back pain, no recent trauma. DERMATOLOGIC: No rash, no itching, no lesions. ENDOCRINE: No polyuria, polydipsia, no heat or cold intolerance. No recent change in weight. HEMATOLOGICAL: No anemia or easy bruising or bleeding. NEUROLOGIC: No headache, seizures, numbness, tingling or weakness. PSYCHIATRIC: No depression, no loss of interest in normal activity or change in sleep pattern.     Exam: chaperone present  BP 128/84 mmHg  Ht 5\' 1"  (1.549 m)  Wt 170 lb (77.111 kg)  BMI 32.14 kg/m2  LMP 03/04/2001  Body mass index is 32.14 kg/(m^2).  General appearance : Well developed well nourished female. No acute distress HEENT: Eyes: no retinal hemorrhage or exudates,  Neck supple, trachea midline, no carotid bruits, no thyroidmegaly Lungs: Clear to auscultation, no rhonchi or wheezes, or rib retractions  Heart: Regular rate and rhythm, no murmurs or gallops Breast:Examined in sitting and supine position were symmetrical in appearance, no palpable masses or tenderness,  no skin retraction, no nipple inversion, no nipple discharge, no skin discoloration, no axillary or supraclavicular lymphadenopathy Abdomen: no palpable masses or tenderness, no rebound or guarding Extremities: no edema or skin discoloration or tenderness  Pelvic:  Bartholin, Urethra, Skene Glands: Within normal limits             Vagina: No gross lesions or discharge  Cervix: Absent  Uterus  absent  Adnexa  Without masses or tenderness  Anus and perineum  normal   Rectovaginal  normal sphincter tone without palpated masses or tenderness             Hemoccult not indicated     Assessment/Plan:  49 y.o. female for annual exam doing well. Normal exam today. PCP doing her blood work. Flu vaccine up-to-date. Patient was lost 10 pounds. Her diabetes and hypercholesterolemia under control by PCP.  Patient is exercising and eating healthier. Pap smear not indicated. Patient was reminded of the importance of monthly breast exam. Patient was reminded of the importance of calcium vitamin D and regular exercise for osteoporosis prevention. Patient will need a baseline colonoscopy next year.   Terrance Mass MD, 9:23 AM 03/14/2015

## 2015-04-19 MED FILL — AZITHROMYCIN 250 MG TABLET: 250 | 5 days supply | Qty: 6 | Fill #0

## 2015-04-20 DIAGNOSIS — J309 Allergic rhinitis, unspecified: Secondary | ICD-10-CM | POA: Diagnosis not present

## 2015-04-20 DIAGNOSIS — Z7984 Long term (current) use of oral hypoglycemic drugs: Secondary | ICD-10-CM | POA: Diagnosis not present

## 2015-04-20 DIAGNOSIS — E782 Mixed hyperlipidemia: Secondary | ICD-10-CM | POA: Diagnosis not present

## 2015-04-20 DIAGNOSIS — Z0000 Encounter for general adult medical examination without abnormal findings: Secondary | ICD-10-CM | POA: Diagnosis not present

## 2015-04-20 DIAGNOSIS — Z1389 Encounter for screening for other disorder: Secondary | ICD-10-CM | POA: Diagnosis not present

## 2015-04-20 DIAGNOSIS — E119 Type 2 diabetes mellitus without complications: Secondary | ICD-10-CM | POA: Diagnosis not present

## 2015-04-20 DIAGNOSIS — I10 Essential (primary) hypertension: Secondary | ICD-10-CM | POA: Diagnosis not present

## 2015-04-20 MED FILL — FLUTICASONE PROP 50 MCG SPR: 50 | 30 days supply | Qty: 16 | Fill #0

## 2015-04-29 MED FILL — LOSARTAN POTASSIUM 25 MG TA: 25 | 30 days supply | Qty: 30 | Fill #0

## 2015-05-02 DIAGNOSIS — E119 Type 2 diabetes mellitus without complications: Secondary | ICD-10-CM | POA: Diagnosis not present

## 2015-05-06 ENCOUNTER — Ambulatory Visit (INDEPENDENT_AMBULATORY_CARE_PROVIDER_SITE_OTHER): Payer: Self-pay | Admitting: Family Medicine

## 2015-05-06 ENCOUNTER — Encounter: Payer: Self-pay | Admitting: Pharmacist

## 2015-05-06 VITALS — BP 112/74 | Wt 178.4 lb

## 2015-05-06 DIAGNOSIS — E119 Type 2 diabetes mellitus without complications: Secondary | ICD-10-CM

## 2015-05-06 NOTE — Progress Notes (Signed)
Subjective:  Patient presents today for 3 month diabetes follow-up as part of the employer-sponsored Link to Wellness program.  Current diabetes regimen includes metformin 500 mg daily.  Patient also continues on daily ARB and statin.  Most recent MD follow-up was with Dr. Lysle Rubens earlier this month. A1C at that visit was 7.0 % (this was up from 6.8%). Patient has a pending appt for April for lab work. No med changes or major health changes at this time.     Assessment:  Diabetes: Most recent A1C was 7.0  % which is at goal of less than 7%. Weight is stable from last visit with me.    CBG Review: Patient brought the meter that she uses at work. She states that normally she checks it twice a week. Since her A1C was elevated earlier this month she is now checking every day. She is checking first thing in the morning before she has anything to eat.   She reports that the lowest CBG she has had on her home meter (which she did not bring) was 84. Denies hypoglycemia. Highest she remembers seeing is 119.   Patient is using old test strips- expiration 2014. I gave patient a new meter today and faxed for new testing supplies to Dr. Deforest Hoyles.   Lifestyle improvements:  Physical Activity-  She states that she is walking in the parking lot at work during her lunch break. She is walking for 45 minutes most days of the workweek (except for when she can't walk because of inclement weather). She states that she wants to start going to the gym again but can't quite get herself over to the gym.    Nutrition-  Patient states that the only difference in eating habits is with breakfast.  B- 1/4 cup grits, 1 boiled egg, 8 oz cranberry juice  She reports that she has cut back on eating chips and sweets. She was eating them every day but now is only eating 1-2 times a week. Congratulated patient on making this change.   Patient reports no other changes.    Follow up with me in 3 months.    Plan/Goals for  Next Visit:  1. Look at the test strips you have at home. If they have expired, throw them away and start using the new meter and new test strips.  2. Start using [employer] so that you can electronically keep track of your workouts. Link your account to the [employer] website and this way it will automatically record your workouts so that you can earn $50.  3. Continue walking on your lunch break during the week. Before your next visit with me get to the gym at [place] at least once. :-)  4. Cut back on juice in the morning to 1/2 cup.   Next appointment to see me is: Friday April 28th at 4 PM.    Jinny Blossom D. Donneta Romberg, PharmD, BCPS, CDE [facility] Parcel to [place] Coordinator [phone]

## 2015-05-09 MED FILL — SM ALCOHOL 70% PREP PADS: 70 | 90 days supply | Qty: 200 | Fill #0

## 2015-05-09 MED FILL — TRUEplus LANCETS 30G MISC: 90 days supply | Qty: 200 | Fill #0

## 2015-05-09 MED FILL — TRUE METRIX GLUCOSE TEST ST: 90 days supply | Qty: 200 | Fill #0

## 2015-05-13 NOTE — Progress Notes (Signed)
I have reviewed this pharmacist's note and agree  

## 2015-07-18 DIAGNOSIS — E782 Mixed hyperlipidemia: Secondary | ICD-10-CM | POA: Diagnosis not present

## 2015-08-05 ENCOUNTER — Encounter: Payer: Self-pay | Admitting: Pharmacist

## 2015-08-05 ENCOUNTER — Other Ambulatory Visit: Payer: Self-pay | Admitting: Pharmacist

## 2015-08-05 VITALS — BP 126/82 | Wt 179.4 lb

## 2015-08-05 DIAGNOSIS — E119 Type 2 diabetes mellitus without complications: Secondary | ICD-10-CM

## 2015-08-05 NOTE — Patient Outreach (Signed)
Subjective:  Patient presents today for 3 month diabetes follow-up as part of the employer-sponsored Link to Wellness program.  Current diabetes regimen includes metformin 500 mg once daily.  Patient also continues on daily ARB and statin.  Most recent MD follow-up was with Dr. Lysle Rubens in January. Patient is unsure what her A1C was at that visit.   Patient has a pending appt for July.  No med changes or major health changes at this time.     Assessment:  Diabetes: Most recent A1C was  6.7 % which is at goal of less than 7%. Weight is stable from last visit with me.    CBG Review: Patient states that she is checking CBG once daily fasting. She denies hypoglycemia.   High/Low- 141/83  The majority of fasting readings are in the 110s.    Lifestyle improvements:  Physical Activity-  She reports getting some cramps in her left calf while she is walking. She is able to get the cramp to release by tapping her foot on the ground.   She is still going over to the gym at [facility] daily after work. She usually spends 65 minutes walking on the treadmill. She is walking at speed 3 on 6% incline. She has been doing this for about a month. She reports that she is feeling better in all aspects of her life. It helps her relax and has helped with stress.   She is also doing the bicycle on occasion.   Nutrition-  She states that she has cut back on breads. She estimates that some days she isn't eating bread. Per week she is getting 4 servings of bread (prior to this, patient was eating close to a loaf a week). She has increased fresh fruit in her diet to 2 servings daily. She is eating pineapple, bananas, oranges, blueberries, strawberries, blackberries, apples.   She has cut out juice (was drinking once daily).    Follow up with me in 3 months. I will send A1C result to Dr. Glenna Durand office.    Plan/Goals for Next Visit:  1. Contact Cleon Signorelli Norriss, Wellness Coordinator, at [phone] to get access  to [employer] so you can start earning points and wellness money. 2. Continue physical activity five days a week.  3. Think about making an appointment to see the dietitian at the [facility] in order to claim the wellness weight category. [place]- [phone]     Next appointment to see me is: Friday July 21st at 4 PM.    Jinny Blossom D. Donneta Romberg, PharmD, BCPS, CDE Norm Parcel to [place] Coordinator [phone]

## 2015-08-08 LAB — POCT GLYCOSYLATED HEMOGLOBIN (HGB A1C): HEMOGLOBIN A1C: 6.7

## 2015-10-19 MED FILL — TRUE METRIX GLUCOSE TEST ST: 90 days supply | Qty: 200 | Fill #1

## 2015-10-19 MED FILL — SM ALCOHOL 70% PREP PADS: 70 | 90 days supply | Qty: 200 | Fill #1

## 2015-10-20 DIAGNOSIS — I10 Essential (primary) hypertension: Secondary | ICD-10-CM | POA: Diagnosis not present

## 2015-10-20 DIAGNOSIS — E782 Mixed hyperlipidemia: Secondary | ICD-10-CM | POA: Diagnosis not present

## 2015-10-20 DIAGNOSIS — E119 Type 2 diabetes mellitus without complications: Secondary | ICD-10-CM | POA: Diagnosis not present

## 2015-10-20 DIAGNOSIS — R0981 Nasal congestion: Secondary | ICD-10-CM | POA: Diagnosis not present

## 2015-10-20 DIAGNOSIS — Z1211 Encounter for screening for malignant neoplasm of colon: Secondary | ICD-10-CM | POA: Diagnosis not present

## 2015-10-20 DIAGNOSIS — J309 Allergic rhinitis, unspecified: Secondary | ICD-10-CM | POA: Diagnosis not present

## 2015-10-20 MED FILL — metFORMIN HCL 500 MG TABS: 500 | 90 days supply | Qty: 90 | Fill #0

## 2015-10-20 MED FILL — AZITHROMYCIN 250 MG TABLET: 250 | 5 days supply | Qty: 6 | Fill #0

## 2015-10-24 MED FILL — ATORVASTATIN 10 MG TABLET: 10 | 90 days supply | Qty: 90 | Fill #1

## 2015-10-24 MED FILL — LOSARTAN POTASSIUM 25 MG TA: 25 | 30 days supply | Qty: 30 | Fill #1

## 2015-10-28 ENCOUNTER — Ambulatory Visit: Payer: Self-pay | Admitting: Pharmacist

## 2015-10-31 ENCOUNTER — Encounter: Payer: Self-pay | Admitting: Pharmacist

## 2015-10-31 ENCOUNTER — Other Ambulatory Visit: Payer: Self-pay | Admitting: Pharmacist

## 2015-10-31 VITALS — BP 118/70 | Wt 178.2 lb

## 2015-10-31 DIAGNOSIS — E119 Type 2 diabetes mellitus without complications: Secondary | ICD-10-CM

## 2015-10-31 NOTE — Patient Outreach (Signed)
Subjective:  Patient presents today for 3 month diabetes follow-up as part of the employer-sponsored [employer] to [employer] program.  Current diabetes regimen includes metformin 500 mg twice daily (this is increased at her last visit with Dr. Lysle Rubens).  Patient also continues on daily ARB and statin.  Most recent MD follow-up was with Dr. Lysle Rubens earlier this month. A1C at that visit was 7.4%.  Patient has a pending appt for 6 months in January. No major health changes at this time.     Assessment:  Diabetes: Most recent A1C was 7.4  % which is exceeding goal of less than 7%. Weight is stable from last visit with me.    CBG Review: Patient is checking once daily in the morning fasting. Majority of readings are between 110-130 mg/dL. Patient denies low blood sugars.   High/Low- 162/104  CBG averages for 14 and 30 days are higher than at last visit by about 10-15 points.  Lifestyle improvements:  Physical Activity-  She is doing the elliptical machine, treadmill and stationary bike for 60 minutes five days a week. She is going five days a week most weeks.    Nutrition-  Patient states that she has been eating more fruit and was eating fruit salad (20 grapes, 10 blueberries, 5-7 strawberries- ~17-20 g carbohydrates). She was eating that once daily for lunch. She has since cut back to just eating grapes and blueberries. When she was eating lunch she would eat the fruit salads sometimes by itself for lunch and sometimes with other food that would come from drug reps.   Reviewed with patient carbohydrate counting and how to estimate the carbohydrates in the grapes she is eating by using a kitchen scale. We reveiwed with patient carbohydrate goals for meals- 45-60 grams with each meal.   I encouraged patient to make an appointment with the RD so that she can claim the weight smart badge within [employer].     Follow up with me in 5 months when I return from maternity leave.     Jaymes Hang D.  Donneta Romberg, PharmD, BCPS, CDE [facility] [phone]  Plan/Goals for Next Visit:  1. Using your kitchen scale, weigh out 20 grapes to see how many grams it weighs. 100 grams of grapes = 17 grams of carbohydrates. 100 grams of strawberries = 8 grams of carbohydrates, 100 grams blueberries = 14 grams carbohydrates, 100 grams cherries = 16  Grams carbohydrates. Remember servings sizes for meals are 45-60 grams carbohydrates.  2. Contact Destynee Stringfellow Norriss, Wellness Coordinator, at [phone] to get access to [employer] [facility] [employer] so you can start earning points and wellness money. 3. Think about making an appointment to see the dietitian at the [facility] in order to claim the wellness weight category. [place]- [phone] 4. Continue physical activity every week. Great job with this!!!    Next appointment to see me is: Friday January 19th at 4 PM.  L

## 2015-12-01 ENCOUNTER — Other Ambulatory Visit: Payer: Self-pay | Admitting: Gastroenterology

## 2015-12-01 MED FILL — GAVILYTE-N SOLUTION: 420 | 1 days supply | Qty: 4000 | Fill #0

## 2015-12-06 ENCOUNTER — Encounter: Payer: 59 | Attending: Internal Medicine | Admitting: Dietician

## 2015-12-06 ENCOUNTER — Encounter: Payer: Self-pay | Admitting: Dietician

## 2015-12-06 DIAGNOSIS — E119 Type 2 diabetes mellitus without complications: Secondary | ICD-10-CM

## 2015-12-06 DIAGNOSIS — Z713 Dietary counseling and surveillance: Secondary | ICD-10-CM | POA: Insufficient documentation

## 2015-12-06 NOTE — Progress Notes (Signed)
Diabetes Self-Management Education  Visit Type: First/Initial  Appt. Start Time: 0815 Appt. End Time: 0930  12/06/2015  [name], identified by name and date of birth, is a 50 y.o. female with a diagnosis of Diabetes: Type 2. Other hx includes hyperlipidemia and GERD. She has been vegetarian for the past 33 years.  Current diet needs increased balance to improve nutrient distribution and blood sugar control.  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  ASSESSMENT  Height 5' 2.25" (1.581 m), weight 177 lb (80.3 kg), last menstrual period 03/04/2001. Body mass index is 32.11 kg/m.      Diabetes Self-Management Education - 12/06/15 0819      Visit Information   Visit Type First/Initial     Initial Visit   Diabetes Type Type 2   Are you currently following a meal plan? Yes   What type of meal plan do you follow? vegetarian   Are you taking your medications as prescribed? Yes   Date Diagnosed October 20, 2015 but prediabetes for 5-6 years prior     Health Coping   How would you rate your overall health? Good     Psychosocial Assessment   Patient Belief/Attitude about Diabetes Motivated to manage diabetes   Self-care barriers None   Self-management support Doctor's office   Other persons present Patient   Patient Concerns Nutrition/Meal planning;Glycemic Control   Special Needs None   Preferred Learning Style No preference indicated   Learning Readiness Change in progress   How often do you need to have someone help you when you read instructions, pamphlets, or other written materials from your doctor or pharmacy? 1 - Never   What is the last grade level you completed in school? 5 years college     Pre-Education Assessment   Patient understands the diabetes disease and treatment process. Needs Review   Patient understands incorporating nutritional management into lifestyle. Needs Review   Patient undertands incorporating physical activity into  lifestyle. Demonstrates understanding / competency   Patient understands using medications safely. Demonstrates understanding / competency   Patient understands monitoring blood glucose, interpreting and using results Needs Review   Patient understands prevention, detection, and treatment of acute complications. Demonstrates understanding / competency   Patient understands prevention, detection, and treatment of chronic complications. Demonstrates understanding / competency   Patient understands how to develop strategies to address psychosocial issues. Demonstrates understanding / competency   Patient understands how to develop strategies to promote health/change behavior. Demonstrates understanding / competency     Complications   Last HgB A1C per patient/outside source 7.4 %  10/20/15   How often do you check your blood sugar? 1-2 times/day   Fasting Blood glucose range (mg/dL) 70-129   Number of hypoglycemic episodes per month 0   Number of hyperglycemic episodes per week 0   Have you had a dilated eye exam in the past 12 months? Yes   Have you had a dental exam in the past 12 months? Yes   Are you checking your feet? Yes   How many days per week are you checking your feet? 7     Dietary Intake   Breakfast green tea with 3 packs sugar or coffee with at least 3 packs sugar and 1 creamer, instant oatmeal or instant grits   Snack (morning) fruit   Lunch catered lunch (salad) or just fruit   Snack (afternoon) occasional fruit and occasional trail m ix   Dinner  PB and  J or egg salad or tomato sandwich or Tablespoon of peanut butter   Snack (evening) none   Beverage(s) water     Exercise   Exercise Type Moderate (swimming / aerobic walking)  treadmill, elyptical, stationary bike   How many days per week to you exercise? 4   How many minutes per day do you exercise? 75   Total minutes per week of exercise 300     Patient Education   Previous Diabetes Education Yes (please comment)   she was here about a year ago   Disease state  Definition of diabetes, type 1 and 2, and the diagnosis of diabetes;Explored patient's options for treatment of their diabetes   Nutrition management  Role of diet in the treatment of diabetes and the relationship between the three main macronutrients and blood glucose level;Food label reading, portion sizes and measuring food.;Meal options for control of blood glucose level and chronic complications.   Physical activity and exercise  Role of exercise on diabetes management, blood pressure control and cardiac health.   Medications Reviewed patients medication for diabetes, action, purpose, timing of dose and side effects.   Monitoring Identified appropriate SMBG and/or A1C goals.;Yearly dilated eye exam;Daily foot exams   Chronic complications Relationship between chronic complications and blood glucose control   Psychosocial adjustment Role of stress on diabetes     Individualized Goals (developed by patient)   Nutrition General guidelines for healthy choices and portions discussed;Follow meal plan discussed   Physical Activity Exercise 3-5 times per week;60 minutes per day   Medications take my medication as prescribed   Monitoring  test my blood glucose as discussed   Reducing Risk examine blood glucose patterns   Health Coping discuss diabetes with (comment)  MD,RD     Post-Education Assessment   Patient understands the diabetes disease and treatment process. Demonstrates understanding / competency   Patient understands incorporating nutritional management into lifestyle. Demonstrates understanding / competency   Patient undertands incorporating physical activity into lifestyle. Demonstrates understanding / competency   Patient understands using medications safely. Demonstrates understanding / competency   Patient understands monitoring blood glucose, interpreting and using results Demonstrates understanding / competency   Patient  understands prevention, detection, and treatment of acute complications. Demonstrates understanding / competency   Patient understands prevention, detection, and treatment of chronic complications. Demonstrates understanding / competency   Patient understands how to develop strategies to address psychosocial issues. Demonstrates understanding / competency   Patient understands how to develop strategies to promote health/change behavior. Demonstrates understanding / competency     Outcomes   Expected Outcomes Demonstrated interest in learning. Expect positive outcomes   Future DMSE Yearly   Program Status Completed      Individualized Plan for Diabetes Self-Management Training:   Learning Objective:  Patient will have a greater understanding of diabetes self-management. Patient education plan is to attend individual and/or group sessions per assessed needs and concerns.   Plan:   Patient Instructions  Watch your fruit intake.  Aim for no more than 1 serving at each meal. Each time you eat a snack or meal have a small amount of protein. See the vegetarian protein list for protein options. Increase your non starchy vegetable intake. Rethink the amount of sugar you use in beverages. Keep up your exercise habit.  This is great! Consider reading the book Becoming Vegetarian  Plan:  Aim for 3 Carb Choices per meal (45 grams) +/- 1 either way  Aim for 0-1 Carbs per snack  if hungry  Include protein in moderation with your meals and snacks Consider reading food labels for Total Carbohydrate and Fat Grams of foods Consider checking BG at alternate times per day as directed by MD  Consider taking medication as directed by MD      Expected Outcomes:  Demonstrated interest in learning. Expect positive outcomes  Education material provided: Living Well with Diabetes, Food label handouts, A1C conversion sheet, Meal plan card, My Plate and Snack sheet, breakfast, vegetarian protein sources,  Vegetarian Diet and Type 2 Diabetes from AND  If problems or questions, patient to contact team via:  Phone and Email  Future DSME appointment: Yearly

## 2015-12-06 NOTE — Patient Instructions (Signed)
Watch your fruit intake.  Aim for no more than 1 serving at each meal. Each time you eat a snack or meal have a small amount of protein. See the vegetarian protein list for protein options. Increase your non starchy vegetable intake. Rethink the amount of sugar you use in beverages. Keep up your exercise habit.  This is great! Consider reading the book Becoming Vegetarian  Plan:  Aim for 3 Carb Choices per meal (45 grams) +/- 1 either way  Aim for 0-1 Carbs per snack if hungry  Include protein in moderation with your meals and snacks Consider reading food labels for Total Carbohydrate and Fat Grams of foods Consider checking BG at alternate times per day as directed by MD  Consider taking medication as directed by MD

## 2015-12-21 ENCOUNTER — Encounter (HOSPITAL_COMMUNITY): Payer: Self-pay | Admitting: *Deleted

## 2015-12-26 ENCOUNTER — Ambulatory Visit (HOSPITAL_COMMUNITY): Payer: 59 | Admitting: Certified Registered Nurse Anesthetist

## 2015-12-26 ENCOUNTER — Ambulatory Visit (HOSPITAL_COMMUNITY)
Admission: RE | Admit: 2015-12-26 | Discharge: 2015-12-26 | Disposition: A | Discharge status: Home / Self Care | Payer: 59 | Source: Ambulatory Visit | Attending: Gastroenterology | Admitting: Gastroenterology

## 2015-12-26 ENCOUNTER — Encounter (HOSPITAL_COMMUNITY)
Admission: RE | Disposition: A | Discharge status: Home / Self Care | Payer: Self-pay | Source: Ambulatory Visit | Attending: Gastroenterology

## 2015-12-26 DIAGNOSIS — Z7984 Long term (current) use of oral hypoglycemic drugs: Secondary | ICD-10-CM | POA: Diagnosis not present

## 2015-12-26 DIAGNOSIS — Z79899 Other long term (current) drug therapy: Secondary | ICD-10-CM | POA: Insufficient documentation

## 2015-12-26 DIAGNOSIS — I10 Essential (primary) hypertension: Secondary | ICD-10-CM | POA: Diagnosis not present

## 2015-12-26 DIAGNOSIS — K635 Polyp of colon: Secondary | ICD-10-CM | POA: Insufficient documentation

## 2015-12-26 DIAGNOSIS — D124 Benign neoplasm of descending colon: Secondary | ICD-10-CM | POA: Insufficient documentation

## 2015-12-26 DIAGNOSIS — E7800 Pure hypercholesterolemia, unspecified: Secondary | ICD-10-CM | POA: Diagnosis not present

## 2015-12-26 DIAGNOSIS — E119 Type 2 diabetes mellitus without complications: Secondary | ICD-10-CM | POA: Insufficient documentation

## 2015-12-26 DIAGNOSIS — K219 Gastro-esophageal reflux disease without esophagitis: Secondary | ICD-10-CM | POA: Diagnosis not present

## 2015-12-26 DIAGNOSIS — Z1211 Encounter for screening for malignant neoplasm of colon: Secondary | ICD-10-CM | POA: Diagnosis not present

## 2015-12-26 DIAGNOSIS — Z7951 Long term (current) use of inhaled steroids: Secondary | ICD-10-CM | POA: Diagnosis not present

## 2015-12-26 DIAGNOSIS — D125 Benign neoplasm of sigmoid colon: Secondary | ICD-10-CM | POA: Diagnosis not present

## 2015-12-26 HISTORY — PX: COLONOSCOPY WITH PROPOFOL: SHX5780

## 2015-12-26 HISTORY — DX: Essential (primary) hypertension: I10

## 2015-12-26 LAB — GLUCOSE, CAPILLARY: Glucose-Capillary: 87 mg/dL (ref 65–99)

## 2015-12-26 SURGERY — COLONOSCOPY WITH PROPOFOL
Anesthesia: Monitor Anesthesia Care

## 2015-12-26 MED ORDER — PROPOFOL 10 MG/ML IV BOLUS
INTRAVENOUS | Status: DC | PRN
  Administered 2015-12-26: 50 mg via INTRAVENOUS
  Administered 2015-12-26: 100 mg via INTRAVENOUS
  Administered 2015-12-26 (×7): 50 mg via INTRAVENOUS

## 2015-12-26 MED ORDER — PROPOFOL 10 MG/ML IV BOLUS
INTRAVENOUS | Status: AC
  Filled 2015-12-26: qty 20

## 2015-12-26 MED ORDER — LACTATED RINGERS IV SOLN
INTRAVENOUS | Status: DC
  Administered 2015-12-26: 1000 mL via INTRAVENOUS

## 2015-12-26 MED ORDER — SODIUM CHLORIDE 0.9 % IV SOLN: INTRAVENOUS | Status: DC

## 2015-12-26 SURGICAL SUPPLY — 22 items

## 2015-12-26 NOTE — Op Note (Signed)
[facility] Patient Name: Alicia Gray Procedure Date: 12/26/2015 MRN: [id number] Attending MD: Garlan Fair , MD Date of Birth: 21-Sep-1965 CSN: [id number] Age: 50 Admit Type: Outpatient Procedure:                Colonoscopy Indications:              Screening for colorectal malignant neoplasm Providers:                Garlan Fair, MD, Laverta Baltimore RN, RN, Alfonso Patten, Technician, Lupita Raider, CRNA Referring MD:              Medicines:                Propofol per Anesthesia Complications:            No immediate complications. Estimated Blood Loss:     Estimated blood loss: none. Procedure:                Pre-Anesthesia Assessment:                           - Prior to the procedure, a History and Physical                            was performed, and patient medications and                            allergies were reviewed. The patient's tolerance of                            previous anesthesia was also reviewed. The risks                            and benefits of the procedure and the sedation                            options and risks were discussed with the patient.                            All questions were answered, and informed consent                            was obtained. Prior Anticoagulants: The patient has                            taken no previous anticoagulant or antiplatelet                            agents. ASA Grade Assessment: II - A patient with                            mild systemic disease. After reviewing the risks  and benefits, the patient was deemed in                            satisfactory condition to undergo the procedure.                           After obtaining informed consent, the colonoscope                            was passed under direct vision. Throughout the                            procedure, the patient's blood pressure, pulse, and                  oxygen saturations were monitored continuously. The                            [id number]) scope was introduced through                            the anus and advanced to the the cecum, identified                            by appendiceal orifice and ileocecal valve. The                            colonoscopy was performed without difficulty. The                            patient tolerated the procedure well. The quality                            of the bowel preparation was good. The appendiceal                            orifice and the rectum were photographed. Scope In: 1:15:40 PM Scope Out: 1:41:43 PM Scope Withdrawal Time: 0 hours 15 minutes 56 seconds  Total Procedure Duration: 0 hours 26 minutes 3 seconds  Findings:      The perianal and digital rectal examinations were normal.      A 5 mm polyp was found in the descending colon. The polyp was sessile.       The polyp was removed with a cold snare. Resection and retrieval were       complete.      A 3 mm polyp was found in the sigmoid colon. The polyp was sessile. The       polyp was removed with a cold biopsy forceps. Resection and retrieval       were complete.      The exam was otherwise without abnormality. Impression:               - One 5 mm polyp in the descending colon, removed                            with a cold snare. Resected  and retrieved.                           - One 3 mm polyp in the sigmoid colon, removed with                            a cold biopsy forceps. Resected and retrieved.                           - The examination was otherwise normal. Moderate Sedation:      N/A- Per Anesthesia Care Recommendation:           - Patient has a contact number available for                            emergencies. The signs and symptoms of potential                            delayed complications were discussed with the                            patient. Return to normal activities  tomorrow.                            Written discharge instructions were provided to the                            patient.                           - Repeat colonoscopy date to be determined after                            pending pathology results are reviewed for                            screening purposes.                           - Resume previous diet.                           - Continue present medications. Procedure Code(s):        --- Professional ---                           [phone], Colonoscopy, flexible; with removal of                            tumor(s), polyp(s), or other lesion(s) by snare                            technique                           [id number], 4, Colonoscopy, flexible; with biopsy,  single or multiple Diagnosis Code(s):        --- Professional ---                           Z12.11, Encounter for screening for malignant                            neoplasm of colon                           D12.4, Benign neoplasm of descending colon                           D12.5, Benign neoplasm of sigmoid colon CPT copyright 2016 American Medical Association. All rights reserved. The codes documented in this report are preliminary and upon coder review may  be revised to meet current compliance requirements. Earle Gell, MD Garlan Fair, MD 12/26/2015 1:48:36 PM This report has been signed electronically. Number of Addenda: 0

## 2015-12-26 NOTE — Transfer of Care (Signed)
Immediate Anesthesia Transfer of Care Note  Patient: Alicia Gray  Procedure(s) Performed: Procedure(s): COLONOSCOPY WITH PROPOFOL (N/A)  Patient Location: PACU  Anesthesia Type:MAC  Level of Consciousness: awake, alert  and oriented  Airway & Oxygen Therapy: Patient Spontanous Breathing  Post-op Assessment: Report given to RN  Post vital signs: Reviewed and stable  Last Vitals:  Vitals:   12/26/15 1233  BP: (!) 151/87  Pulse: 78  Resp: (!) 21  Temp: 36.8 C    Last Pain:  Vitals:   12/26/15 1233  TempSrc: Oral         Complications: No apparent anesthesia complications

## 2015-12-26 NOTE — H&P (Signed)
Procedure: Baseline screening colonoscopy  History: The patient is a 50 year old female born 06/29/1965. She is scheduled to undergo a screening colonoscopy today.  Medication allergies: Aspirin. Penicillin. Sulfa drugs.  Past medical history: Total abdominal hysterectomy. Bilateral salpingo-oophorectomy. Type 2 diabetes mellitus. Hypercholesterolemia. Left ovarian cyst. Hypertension.  Exam: The patient is alert and lying comfortably on the endoscopy stretcher. Abdomen is soft and nontender to palpation. Lungs are clear to auscultation. Cardiac exam reveals a regular rhythm.  Plan: Proceed with screening colonoscopy

## 2015-12-26 NOTE — Anesthesia Preprocedure Evaluation (Signed)
Anesthesia Evaluation  Patient identified by MRN, date of birth, ID band Patient awake    Reviewed: Allergy & Precautions, NPO status , Patient's Chart, lab work & pertinent test results  Airway Mallampati: II  TM Distance: >3 FB Neck ROM: Full    Dental  (+) Dental Advisory Given   Pulmonary neg pulmonary ROS,    breath sounds clear to auscultation       Cardiovascular hypertension, Pt. on medications  Rhythm:Regular Rate:Normal     Neuro/Psych negative neurological ROS     GI/Hepatic Neg liver ROS, GERD  ,  Endo/Other  diabetes, Type 2, Oral Hypoglycemic Agents  Renal/GU negative Renal ROS     Musculoskeletal   Abdominal   Peds  Hematology   Anesthesia Other Findings   Reproductive/Obstetrics                             Anesthesia Physical Anesthesia Plan  ASA: II  Anesthesia Plan: MAC   Post-op Pain Management:    Induction: Intravenous  Airway Management Planned: Natural Airway, Nasal Cannula and Simple Face Mask  Additional Equipment:   Intra-op Plan:   Post-operative Plan:   Informed Consent: I have reviewed the patients History and Physical, chart, labs and discussed the procedure including the risks, benefits and alternatives for the proposed anesthesia with the patient or authorized representative who has indicated his/her understanding and acceptance.   Dental advisory given  Plan Discussed with: CRNA  Anesthesia Plan Comments:         Anesthesia Quick Evaluation

## 2015-12-26 NOTE — Discharge Instructions (Signed)

## 2015-12-27 ENCOUNTER — Encounter (HOSPITAL_COMMUNITY): Payer: Self-pay | Admitting: Gastroenterology

## 2015-12-27 NOTE — Anesthesia Postprocedure Evaluation (Signed)
Anesthesia Post Note  Patient: Alicia Gray  Procedure(s) Performed: Procedure(s) (LRB): COLONOSCOPY WITH PROPOFOL (N/A)  Patient location during evaluation: PACU Anesthesia Type: MAC Level of consciousness: awake and alert Pain management: pain level controlled Vital Signs Assessment: post-procedure vital signs reviewed and stable Respiratory status: spontaneous breathing, nonlabored ventilation, respiratory function stable and patient connected to nasal cannula oxygen Cardiovascular status: stable and blood pressure returned to baseline Anesthetic complications: no    Last Vitals:  Vitals:   12/26/15 1400 12/26/15 1409  BP: 108/63 122/74  Pulse: 71 69  Resp: 18 20  Temp:      Last Pain:  Vitals:   12/26/15 1233  TempSrc: Oral                 Tiajuana Amass

## 2016-01-20 MED FILL — metFORMIN HCL 500 MG TABS: 500 | 90 days supply | Qty: 90 | Fill #1

## 2016-01-20 MED FILL — ATORVASTATIN 10 MG TABLET: 10 | 90 days supply | Qty: 90 | Fill #2

## 2016-01-20 MED FILL — AZITHROMYCIN 250 MG TABLET: 250 | 5 days supply | Qty: 6 | Fill #0

## 2016-01-20 MED FILL — LOSARTAN POTASSIUM 25 MG TA: 25 | 30 days supply | Qty: 30 | Fill #2

## 2016-01-30 ENCOUNTER — Encounter: Payer: Self-pay | Admitting: Gynecology

## 2016-01-30 DIAGNOSIS — Z1231 Encounter for screening mammogram for malignant neoplasm of breast: Secondary | ICD-10-CM | POA: Diagnosis not present

## 2016-03-12 MED FILL — TRUE METRIX GLUCOSE TEST ST: 90 days supply | Qty: 200 | Fill #2

## 2016-03-12 MED FILL — LOSARTAN POTASSIUM 25 MG TA: 25 | 30 days supply | Qty: 30 | Fill #3

## 2016-03-15 ENCOUNTER — Ambulatory Visit (INDEPENDENT_AMBULATORY_CARE_PROVIDER_SITE_OTHER): Payer: 59 | Admitting: Gynecology

## 2016-03-15 ENCOUNTER — Encounter: Payer: Self-pay | Admitting: Gynecology

## 2016-03-15 VITALS — BP 128/84 | Ht 62.0 in | Wt 180.0 lb

## 2016-03-15 DIAGNOSIS — Z01411 Encounter for gynecological examination (general) (routine) with abnormal findings: Secondary | ICD-10-CM

## 2016-03-15 DIAGNOSIS — N7011 Chronic salpingitis: Secondary | ICD-10-CM | POA: Diagnosis not present

## 2016-03-15 DIAGNOSIS — N83201 Unspecified ovarian cyst, right side: Secondary | ICD-10-CM | POA: Diagnosis not present

## 2016-03-15 NOTE — Progress Notes (Signed)
Alicia Gray Oct 07, 1965 [id number]   History:    50 y.o.  for annual gyn exam with no complaints today. Review of her record indicates she has a past history of total abdominal hysterectomy secondary to symptomatic leiomyomatous uteri. Patient has been followed every year as a result of left hydrosalpinx. Her ultrasound in 2016 to return the following:  Absent uterus. Right ovarian echo-free follicle 21 x 16 mm. Left ovary no evidence of previous cyst seen on ultrasound. Continued presence of a small tubal cystic structure avascular/hydrosalpinx measured 14 x 13 mm. No fluid in the cul-de-sac.  Patient had normal CA1 25 in 2012 in July 2015. Patient with no past history of abnormal Pap smears.  Patient states she had a colonoscopy this year and benign polyp was removed.  Patient's PCP Dr. Deforest Hoyles has been doing her blood work and her vaccines are up-to-date including the flu vaccine.  Past medical history,surgical history, family history and social history were all reviewed and documented in the EPIC chart.  Gynecologic History Patient's last menstrual period was 03/04/2001. Contraception: status post hysterectomy Last Pap: 2015. Results were: normal Last mammogram: 2017. Results were: normal  Obstetric History OB History  Gravida Para Term Preterm AB Living  3 0     3 0  SAB TAB Ectopic Multiple Live Births  3            # Outcome Date GA Lbr Len/2nd Weight Sex Delivery Anes PTL Lv  3 SAB           2 SAB           1 SAB                ROS: A ROS was performed and pertinent positives and negatives are included in the history.  GENERAL: No fevers or chills. HEENT: No change in vision, no earache, sore throat or sinus congestion. NECK: No pain or stiffness. CARDIOVASCULAR: No chest pain or pressure. No palpitations. PULMONARY: No shortness of breath, cough or wheeze. GASTROINTESTINAL: No abdominal pain, nausea, vomiting or diarrhea, melena or bright red blood per rectum.  GENITOURINARY: No urinary frequency, urgency, hesitancy or dysuria. MUSCULOSKELETAL: No joint or muscle pain, no back pain, no recent trauma. DERMATOLOGIC: No rash, no itching, no lesions. ENDOCRINE: No polyuria, polydipsia, no heat or cold intolerance. No recent change in weight. HEMATOLOGICAL: No anemia or easy bruising or bleeding. NEUROLOGIC: No headache, seizures, numbness, tingling or weakness. PSYCHIATRIC: No depression, no loss of interest in normal activity or change in sleep pattern.     Exam: chaperone present  BP 128/84   Ht 5\' 2"  (1.575 m)   Wt 180 lb (81.6 kg)   LMP 03/04/2001   BMI 32.92 kg/m   Body mass index is 32.92 kg/m.  General appearance : Well developed well nourished female. No acute distress HEENT: Eyes: no retinal hemorrhage or exudates,  Neck supple, trachea midline, no carotid bruits, no thyroidmegaly Lungs: Clear to auscultation, no rhonchi or wheezes, or rib retractions  Heart: Regular rate and rhythm, no murmurs or gallops Breast:Examined in sitting and supine position were symmetrical in appearance, no palpable masses or tenderness,  no skin retraction, no nipple inversion, no nipple discharge, no skin discoloration, no axillary or supraclavicular lymphadenopathy Abdomen: no palpable masses or tenderness, no rebound or guarding Extremities: no edema or skin discoloration or tenderness  Pelvic:  Bartholin, Urethra, Skene Glands: Within normal limits  Vagina: No gross lesions or discharge  Cervix: Absent  Uterus absent  Adnexa  Without masses or tenderness  Anus and perineum  normal   Rectovaginal  normal sphincter tone without palpated masses or tenderness             Hemoccult colonoscopy this year benign polyps removed     Assessment/Plan:  50 y.o. female for annual exam will return to the office the next few weeks for follow-up ultrasound on her small right ovarian cyst as well as on her hydrosalpinx. PCP has been doing the remainder of  her blood work and her vaccines are up-to-date. We discussed importance of calcium vitamin D and weightbearing exercises for osteoporosis prevention. Patient with no vasomotor symptoms currently on no hormone replacement therapy.   Terrance Mass MD, 9:06 AM 03/15/2016

## 2016-03-16 ENCOUNTER — Other Ambulatory Visit: Payer: Self-pay | Admitting: *Deleted

## 2016-03-16 DIAGNOSIS — N83201 Unspecified ovarian cyst, right side: Secondary | ICD-10-CM

## 2016-03-19 MED FILL — HYDROCODON-APAP 7.5-325: 7.5-325 | 4 days supply | Qty: 24 | Fill #0

## 2016-03-22 MED FILL — AZITHROMYCIN 250 MG TABLET: 250 | 5 days supply | Qty: 6 | Fill #0

## 2016-04-13 ENCOUNTER — Ambulatory Visit: Payer: Self-pay | Admitting: Pharmacist

## 2016-04-16 ENCOUNTER — Ambulatory Visit (INDEPENDENT_AMBULATORY_CARE_PROVIDER_SITE_OTHER): Payer: 59 | Admitting: Gynecology

## 2016-04-16 ENCOUNTER — Ambulatory Visit (INDEPENDENT_AMBULATORY_CARE_PROVIDER_SITE_OTHER): Payer: 59

## 2016-04-16 ENCOUNTER — Encounter: Payer: Self-pay | Admitting: Gynecology

## 2016-04-16 VITALS — BP 130/92

## 2016-04-16 DIAGNOSIS — N8300 Follicular cyst of ovary, unspecified side: Secondary | ICD-10-CM

## 2016-04-16 DIAGNOSIS — N7011 Chronic salpingitis: Secondary | ICD-10-CM | POA: Diagnosis not present

## 2016-04-16 DIAGNOSIS — N83201 Unspecified ovarian cyst, right side: Secondary | ICD-10-CM | POA: Diagnosis not present

## 2016-04-16 NOTE — Progress Notes (Signed)
   Patient is a 51 year old who presented to the office today to discuss her ultrasound. She was asymptomatic.  Review of her record indicates she has a past history of total abdominal hysterectomy secondary to symptomatic leiomyomatous uteri. Patient has been followed every year as a result of left hydrosalpinx. Her ultrasound in 2016 to return the following:  Absent uterus. Right ovarian echo-free follicle 21 x 16 mm. Left ovary no evidence of previous cyst seen on ultrasound. Continued presence of a small tubal cystic structure avascular/hydrosalpinx measured 14 x 13 mm. No fluid in the cul-de-sac.  Patient had normal CA1 25 in 2012 in July 2015. Patient with no past history of abnormal Pap smears.  I reviewed all her ultrasounds dating back to 2010. The small hydrosalpinx has remained stable in size since 2014.  Assessment/plan: Right ovarian follicle benign in appearance. Stable small hydrosalpinx on left unchanged is 2014 patient asymptomatic. She has had normal CA 125 in 2012 and 2015 we'll recheck again today. Patient was presented with 2 options. Continue to follow-up with ultrasounds once a year as she becomes symptomatic or proceed with laparoscopic bilateral salpingo-oophorectomy as an outpatient. She has opted to proceed to continue to follow since she has been asymptomatic and his been stable and small since 2014.  Greater than 50% of time was spent counseling quantity care of this patient and reviewed ultrasounds dating back since 2010. Time of consultation 15 minutes

## 2016-04-17 LAB — CA 125: CA 125: 7 U/mL (ref <35)

## 2016-04-20 DIAGNOSIS — Z1389 Encounter for screening for other disorder: Secondary | ICD-10-CM | POA: Diagnosis not present

## 2016-04-20 DIAGNOSIS — J309 Allergic rhinitis, unspecified: Secondary | ICD-10-CM | POA: Diagnosis not present

## 2016-04-20 DIAGNOSIS — Z7984 Long term (current) use of oral hypoglycemic drugs: Secondary | ICD-10-CM | POA: Diagnosis not present

## 2016-04-20 DIAGNOSIS — K219 Gastro-esophageal reflux disease without esophagitis: Secondary | ICD-10-CM | POA: Diagnosis not present

## 2016-04-20 DIAGNOSIS — I10 Essential (primary) hypertension: Secondary | ICD-10-CM | POA: Diagnosis not present

## 2016-04-20 DIAGNOSIS — E782 Mixed hyperlipidemia: Secondary | ICD-10-CM | POA: Diagnosis not present

## 2016-04-20 DIAGNOSIS — Z0000 Encounter for general adult medical examination without abnormal findings: Secondary | ICD-10-CM | POA: Diagnosis not present

## 2016-04-20 DIAGNOSIS — E119 Type 2 diabetes mellitus without complications: Secondary | ICD-10-CM | POA: Diagnosis not present

## 2016-04-23 MED FILL — VIT D2 1.25 MG (50,000 UNIT: 1.25 MG | 90 days supply | Qty: 12 | Fill #0

## 2016-04-27 ENCOUNTER — Ambulatory Visit: Payer: Self-pay | Admitting: Pharmacist

## 2016-05-02 DIAGNOSIS — R509 Fever, unspecified: Secondary | ICD-10-CM | POA: Diagnosis not present

## 2016-05-02 DIAGNOSIS — J101 Influenza due to other identified influenza virus with other respiratory manifestations: Secondary | ICD-10-CM | POA: Diagnosis not present

## 2016-05-02 MED FILL — HYDROCODONE-CHLORPHENIRAM S: 10-8 | 10 days supply | Qty: 100 | Fill #0

## 2016-05-02 MED FILL — OSELTAMIVIR PHOS 75 MG CAP: 75 | 5 days supply | Qty: 10 | Fill #0

## 2016-05-11 DIAGNOSIS — E119 Type 2 diabetes mellitus without complications: Secondary | ICD-10-CM | POA: Diagnosis not present

## 2016-05-18 MED FILL — LOSARTAN POTASSIUM 25 MG TA: 25 | 90 days supply | Qty: 90 | Fill #0

## 2016-06-10 IMAGING — CR DG LUMBAR SPINE 2-3V
3 series · 3 of 3 positions shown · non-contrast
Comparison: Multiple exams, including 06/15/2010

CLINICAL DATA: Chronic persisted low back radiating pain in both
legs, since 6000.

EXAM:
LUMBAR SPINE - 2-3 VIEW

[t l-spine a.p. *]
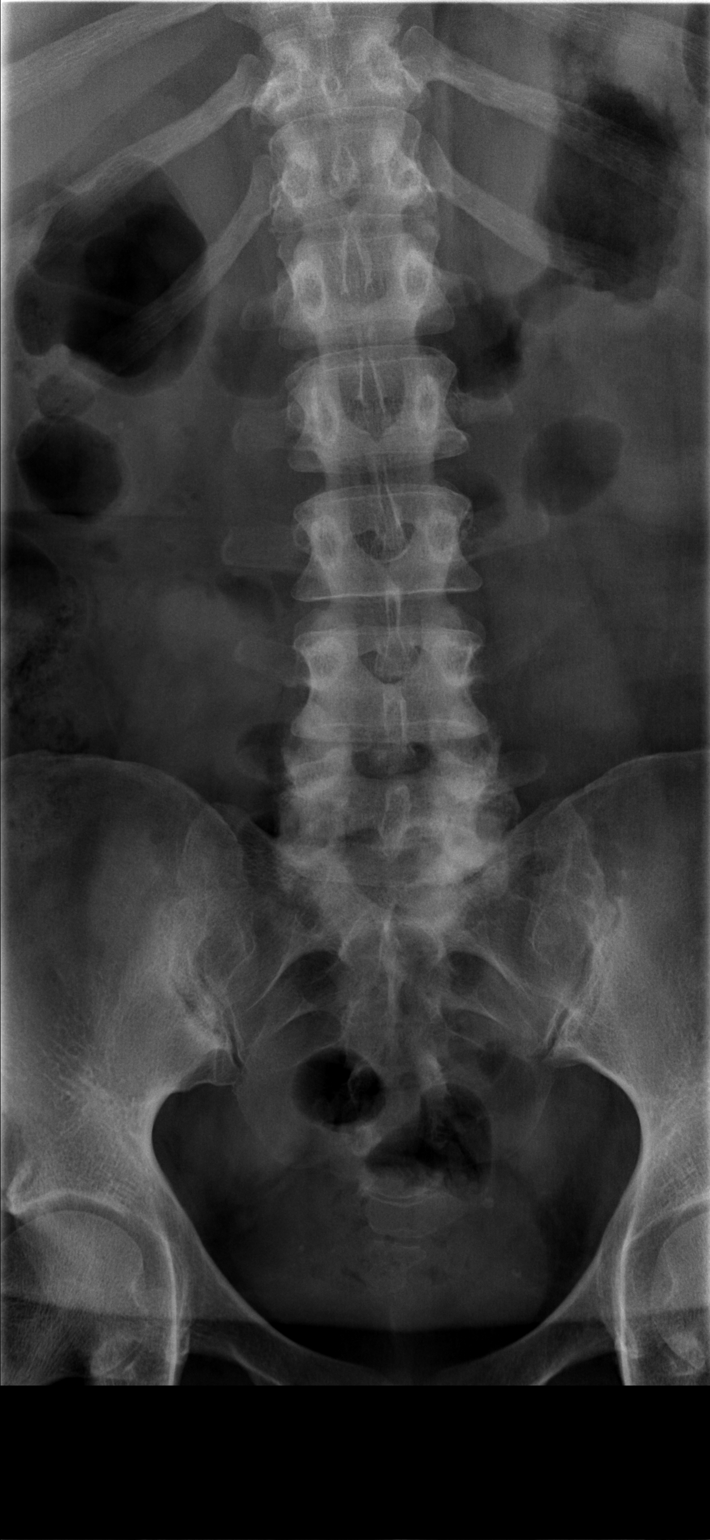

[t l-spine lat]
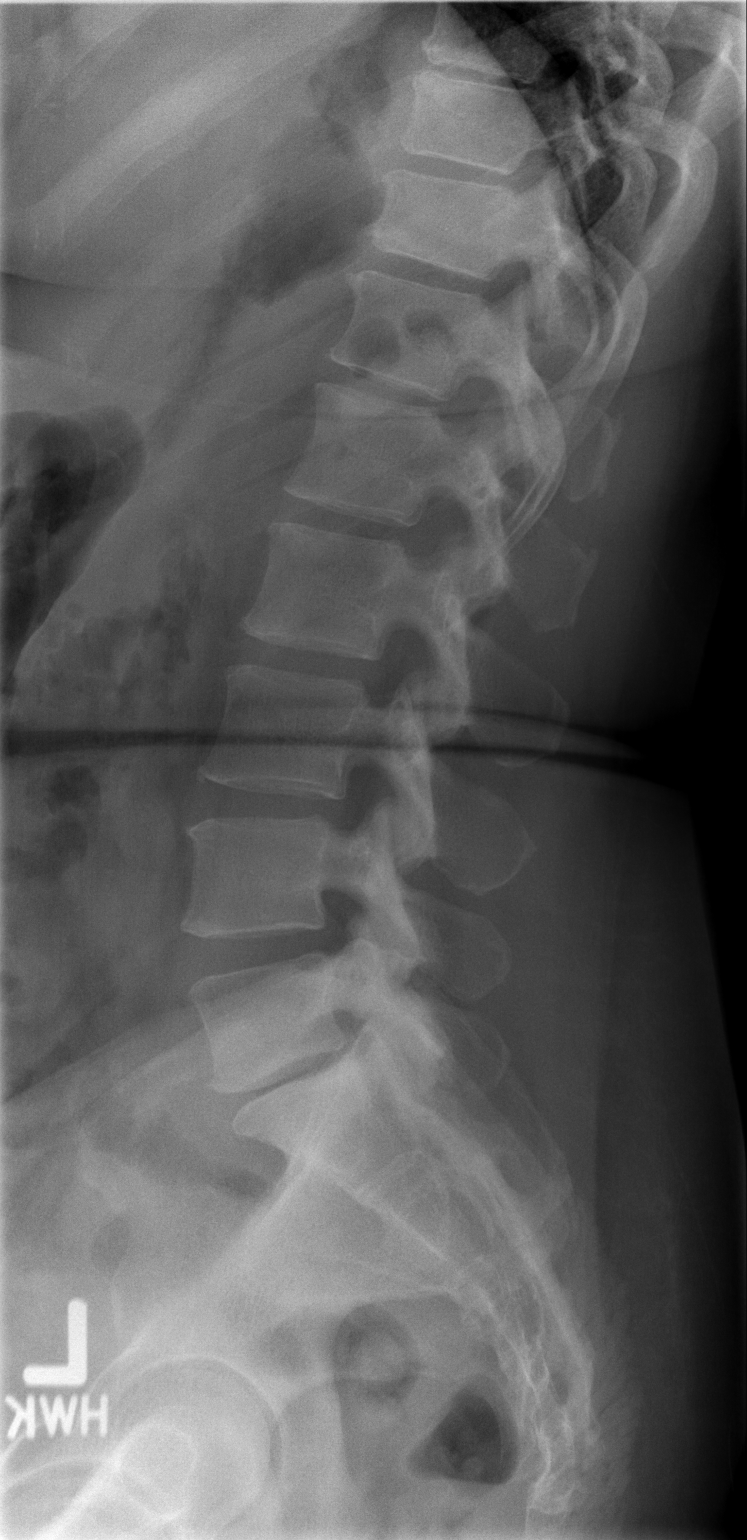

[t l-spine l5-s1 spot]
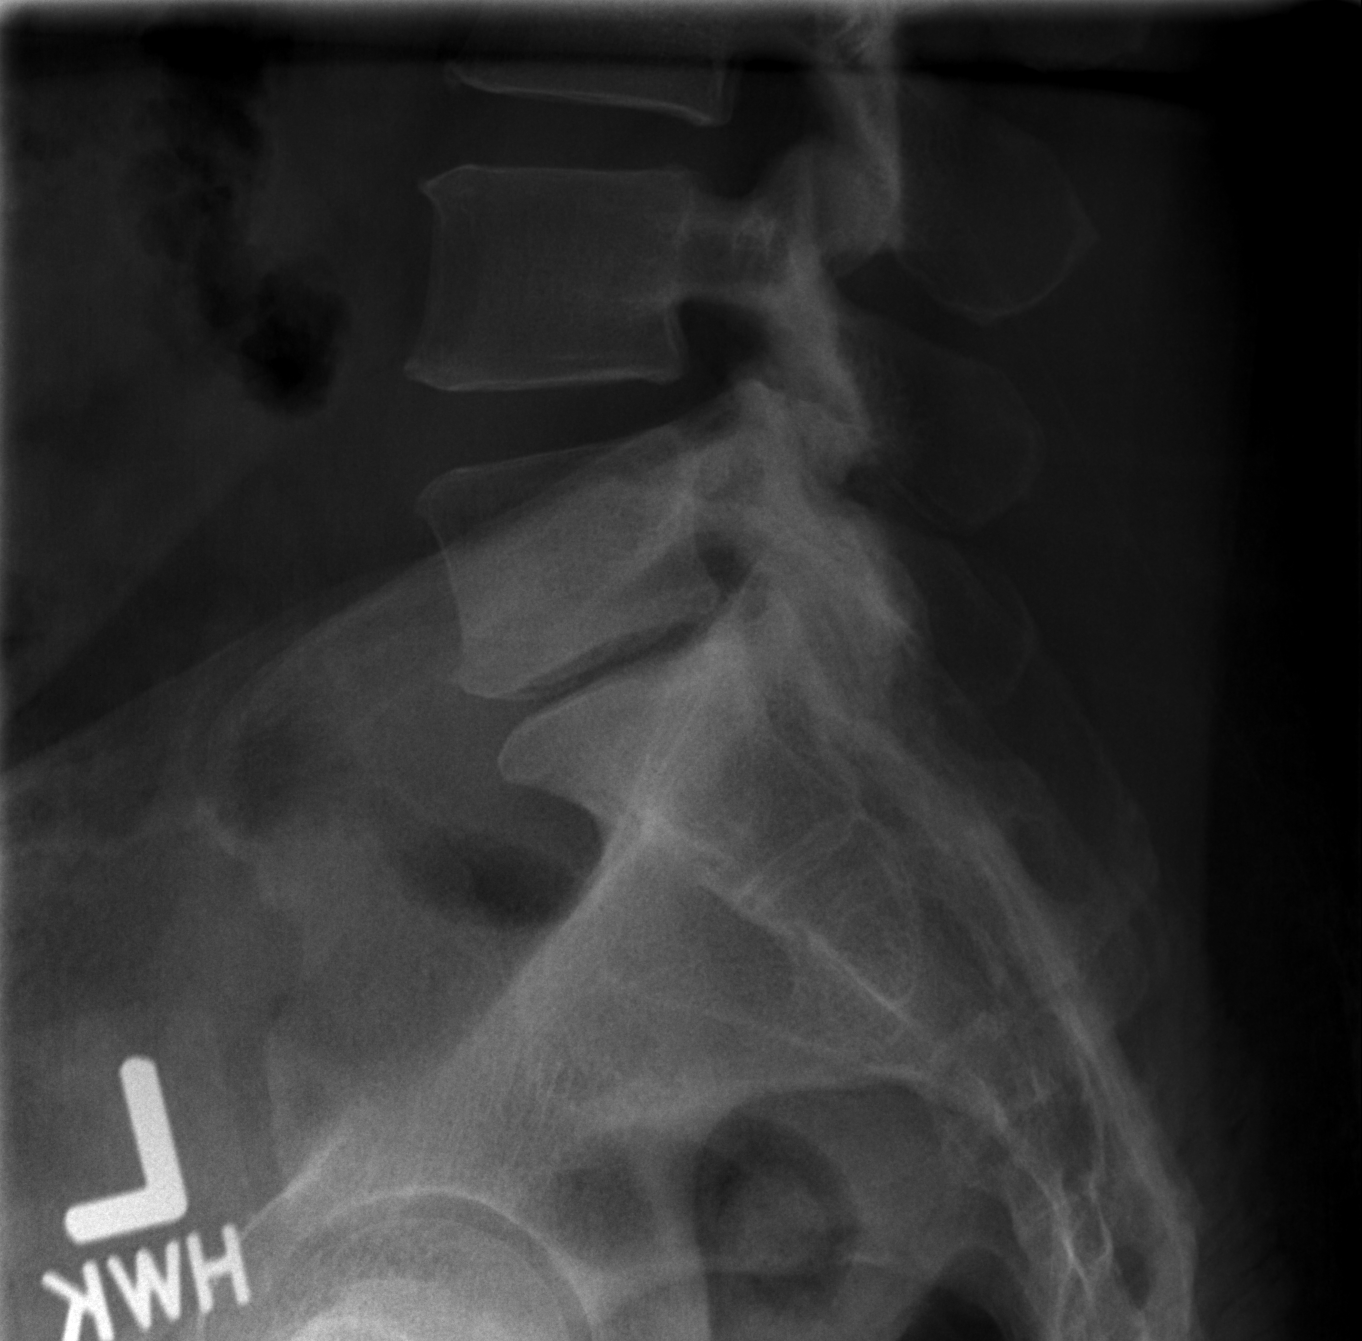

[3 of 3 positions shown; findings below may reference images not displayed]

FINDINGS: Continued loss of intervertebral disc height at the L5-S1 level with
mild endplate sclerosis. Facet arthropathy bilaterally at L4-5 and
L5-S 1. No malalignment or fracture identified.
IMPRESSION: 1. Spondylosis at L4-5 and L5-S1, with degenerative disc disease at
L5-S 1, relatively stable from the prior exam.

## 2016-06-11 DIAGNOSIS — H04123 Dry eye syndrome of bilateral lacrimal glands: Secondary | ICD-10-CM | POA: Diagnosis not present

## 2016-06-22 MED FILL — ACCU-CHEK FASTCLIX LANCETS: 50 days supply | Qty: 102 | Fill #0

## 2016-06-22 MED FILL — ACCU-CHEK GUIDE TEST STRIP: 50 days supply | Qty: 100 | Fill #0

## 2016-06-26 MED FILL — SM ALCOHOL 70% PREP PADS: 70 | 90 days supply | Qty: 200 | Fill #0

## 2016-07-12 MED FILL — VIT D2 1.25 MG (50,000 UNIT: 1.25 MG | 90 days supply | Qty: 12 | Fill #1

## 2016-08-22 ENCOUNTER — Encounter: Payer: Self-pay | Admitting: Gynecology

## 2016-09-26 MED FILL — metFORMIN HCL 500 MG TABS: 500 | 90 days supply | Qty: 90 | Fill #0

## 2016-09-28 MED FILL — ACCU-CHEK FASTCLIX LANCETS: 50 days supply | Qty: 102 | Fill #1

## 2016-09-28 MED FILL — ACCU-CHEK GUIDE TEST STRIP: 50 days supply | Qty: 100 | Fill #1

## 2016-10-18 DIAGNOSIS — E782 Mixed hyperlipidemia: Secondary | ICD-10-CM | POA: Diagnosis not present

## 2016-10-18 DIAGNOSIS — E1165 Type 2 diabetes mellitus with hyperglycemia: Secondary | ICD-10-CM | POA: Diagnosis not present

## 2016-10-18 DIAGNOSIS — K219 Gastro-esophageal reflux disease without esophagitis: Secondary | ICD-10-CM | POA: Diagnosis not present

## 2016-10-18 DIAGNOSIS — Z7984 Long term (current) use of oral hypoglycemic drugs: Secondary | ICD-10-CM | POA: Diagnosis not present

## 2016-10-18 DIAGNOSIS — J309 Allergic rhinitis, unspecified: Secondary | ICD-10-CM | POA: Diagnosis not present

## 2016-10-18 DIAGNOSIS — E119 Type 2 diabetes mellitus without complications: Secondary | ICD-10-CM | POA: Diagnosis not present

## 2016-10-18 DIAGNOSIS — I10 Essential (primary) hypertension: Secondary | ICD-10-CM | POA: Diagnosis not present

## 2016-10-19 MED FILL — VIT D2 1.25 MG (50,000 UNIT: 1.25 MG | 90 days supply | Qty: 12 | Fill #2

## 2016-11-01 MED FILL — metFORMIN HCL 500 MG TABS: 500 | 90 days supply | Qty: 180 | Fill #0

## 2017-01-11 MED FILL — VIT D2 1.25 MG (50,000 UNIT: 1.25 MG | 90 days supply | Qty: 12 | Fill #3

## 2017-01-11 MED FILL — LOSARTAN POTASSIUM 25 MG TA: 25 | 90 days supply | Qty: 90 | Fill #1

## 2017-01-30 ENCOUNTER — Encounter: Payer: Self-pay | Admitting: Gynecology

## 2017-01-30 DIAGNOSIS — Z1231 Encounter for screening mammogram for malignant neoplasm of breast: Secondary | ICD-10-CM | POA: Diagnosis not present

## 2017-02-18 MED FILL — ACCU-CHEK GUIDE TEST STRIP: 50 days supply | Qty: 100 | Fill #2

## 2017-03-18 ENCOUNTER — Encounter: Payer: 59 | Admitting: Gynecology

## 2017-03-26 ENCOUNTER — Ambulatory Visit (INDEPENDENT_AMBULATORY_CARE_PROVIDER_SITE_OTHER): Payer: 59 | Admitting: Gynecology

## 2017-03-26 ENCOUNTER — Encounter: Payer: Self-pay | Admitting: Gynecology

## 2017-03-26 VITALS — BP 124/82 | Ht 61.5 in | Wt 177.0 lb

## 2017-03-26 DIAGNOSIS — Z01419 Encounter for gynecological examination (general) (routine) without abnormal findings: Secondary | ICD-10-CM | POA: Diagnosis not present

## 2017-03-26 DIAGNOSIS — N7011 Chronic salpingitis: Secondary | ICD-10-CM

## 2017-03-26 MED FILL — ULTICARE ALCOHOL SWABS 70 %: 70 | 90 days supply | Qty: 200 | Fill #1

## 2017-03-26 MED FILL — metFORMIN HCL 500 MG TABS: 500 | 90 days supply | Qty: 180 | Fill #1

## 2017-03-26 MED FILL — VIT D2 1.25 MG (50,000 UNIT: 1.25 MG | 84 days supply | Qty: 12 | Fill #0

## 2017-03-26 NOTE — Progress Notes (Signed)
    Alicia Gray 05-02-1965 [id number]        51 y.o.  G3P0030 for annual gynecologic exam.  Former patient of Dr. Toney Rakes.  Without GYN complaints.  Status post TAH for leiomyoma in the past.  Has been followed for small suspected hydrosalpinx on the left over the past several years with serial ultrasounds always remaining stable, simple cystic with no Doppler flow.  CA 125 earlier this year was 7.  Largest measurement 17 mm.  Past medical history,surgical history, problem list, medications, allergies, family history and social history were all reviewed and documented as reviewed in the EPIC chart.  ROS:  Performed with pertinent positives and negatives included in the history, assessment and plan.   Additional significant findings : None   Exam: Caryn Bee assistant Vitals:   03/26/17 0850  BP: 124/82  Weight: 177 lb (80.3 kg)  Height: 5' 1.5" (1.562 m)   Body mass index is 32.9 kg/m.  General appearance:  Normal affect, orientation and appearance. Skin: Grossly normal HEENT: Without gross lesions.  No cervical or supraclavicular adenopathy. Thyroid normal.  Lungs:  Clear without wheezing, rales or rhonchi Cardiac: RR, without RMG Abdominal:  Soft, nontender, without masses, guarding, rebound, organomegaly or hernia Breasts:  Examined lying and sitting without masses, retractions, discharge or axillary adenopathy. Pelvic:  Ext, BUS, Vagina: Normal  Adnexa: Without masses or tenderness    Anus and perineum: Normal   Rectovaginal: Normal sphincter tone without palpated masses or tenderness.    Assessment/Plan:  51 y.o. G1P0030 female for annual gynecologic exam.  Status post TAH for leiomyoma  1. Suspected left small hydrosalpinx unchanged over the past several years with negative CA 125's.  Patient without symptoms.  I reviewed the situation with the patient in the overwhelming likelihood that this is a benign process given it is small, simple, avascular and unchanged  in years monitoring.  At this point I am comfortable with no further studies and she agrees with this.  She will follow-up if she develops any symptoms or other issues. 2. Status post TAH for leiomyoma.  No hot flushes, night sweats or vaginal dryness to suggest menopause at this point.  Continue to monitor and report any issues. 3. Mammography 01/2017.  Continue with annual mammography when due.  Breast exam normal today.  SBE monthly reviewed. 4. Colonoscopy 2017.  Pete at their recommended interval. 5. Pap smear/HPV negative 2015.  No history of abnormal Pap smears previously.  No Pap smear done today.  Options to stop screening per current screening guidelines based on hysterectomy history reviewed.  Will readdress on an annual basis. 6. Health maintenance.  No routine lab work done as patient does this elsewhere.  Follow-up 1 year, sooner as needed.   Anastasio Auerbach MD, 9:21 AM 03/26/2017

## 2017-03-26 NOTE — Patient Instructions (Signed)
Follow-up in 1 year for annual exam, sooner as needed. 

## 2017-04-05 MED FILL — ACCU-CHEK GUIDE TEST STRIP: 50 days supply | Qty: 100 | Fill #3

## 2017-04-15 MED FILL — AZITHROMYCIN 250 MG TABLET: 250 | 5 days supply | Qty: 6 | Fill #0

## 2017-04-15 MED FILL — BENZONATATE 100 MG CAPS: 100 | 5 days supply | Qty: 15 | Fill #0

## 2017-04-15 MED FILL — MUCINEX DM ER 600-30 MG TAB: 30-600 | 5 days supply | Qty: 10 | Fill #0

## 2017-05-08 MED FILL — LOSARTAN POTASSIUM 25 MG TA: 25 | 90 days supply | Qty: 90 | Fill #2

## 2017-05-20 MED FILL — ACCU-CHEK GUIDE TEST STRIP: 50 days supply | Qty: 100 | Fill #4

## 2017-06-07 MED FILL — metFORMIN HCL 500 MG TABS: 500 | 30 days supply | Qty: 90 | Fill #0

## 2017-06-10 MED FILL — ULTICARE ALCOHOL SWABS 70 %: 70 | 90 days supply | Qty: 200 | Fill #2

## 2017-06-10 MED FILL — ATORVASTATIN 10 MG TABLET: 10 | 90 days supply | Qty: 90 | Fill #0

## 2017-07-05 MED FILL — VIT D2 1.25 MG (50,000 UNIT: 1.25 MG | 84 days supply | Qty: 12 | Fill #1

## 2017-08-12 MED FILL — LOSARTAN POTASSIUM 25 MG TA: 25 | 90 days supply | Qty: 90 | Fill #0

## 2017-08-30 MED FILL — metFORMIN HCL 500 MG TABS: 500 | 30 days supply | Qty: 90 | Fill #1

## 2017-08-30 MED FILL — ACCU-CHEK GUIDE STRP: 50 days supply | Qty: 100 | Fill #0

## 2017-09-11 MED FILL — ULTICARE ALCOHOL SWABS PADS: 90 days supply | Qty: 200 | Fill #0

## 2017-09-11 MED FILL — ATORVASTATIN 10 MG TABLET: 10 | 90 days supply | Qty: 90 | Fill #1

## 2017-10-11 MED FILL — VIT D2 1.25 MG (50,000 UNIT: 1.25 MG | 84 days supply | Qty: 12 | Fill #2

## 2017-10-11 MED FILL — metFORMIN HCL 500 MG TABS: 500 | 30 days supply | Qty: 90 | Fill #2

## 2017-11-18 MED FILL — LOSARTAN POTASSIUM 25 MG TA: 25 | 90 days supply | Qty: 90 | Fill #1

## 2017-12-02 MED FILL — metFORMIN HCL 500 MG TABS: 500 | 30 days supply | Qty: 90 | Fill #3

## 2017-12-11 MED FILL — ATORVASTATIN 10 MG TABLET: 10 | 90 days supply | Qty: 90 | Fill #2

## 2018-01-17 MED FILL — VIT D2 1.25 MG (50,000 UNIT: 1.25 MG | 84 days supply | Qty: 12 | Fill #3

## 2018-01-17 MED FILL — metFORMIN HCL 500 MG TABS: 500 | 30 days supply | Qty: 90 | Fill #4

## 2018-01-31 ENCOUNTER — Encounter: Payer: Self-pay | Admitting: Gynecology

## 2018-02-17 MED FILL — LOSARTAN POTASSIUM 25 MG TA: 25 | 90 days supply | Qty: 90 | Fill #2

## 2018-03-03 MED FILL — metFORMIN HCL 500 MG TABS: 500 | 30 days supply | Qty: 90 | Fill #0

## 2018-03-17 MED FILL — ATORVASTATIN 10 MG TABLET: 10 | 90 days supply | Qty: 90 | Fill #3

## 2018-03-27 ENCOUNTER — Ambulatory Visit (INDEPENDENT_AMBULATORY_CARE_PROVIDER_SITE_OTHER): Payer: No Typology Code available for payment source | Admitting: Gynecology

## 2018-03-27 ENCOUNTER — Encounter: Payer: Self-pay | Admitting: Gynecology

## 2018-03-27 VITALS — BP 124/80 | Ht 61.5 in | Wt 179.0 lb

## 2018-03-27 DIAGNOSIS — N952 Postmenopausal atrophic vaginitis: Secondary | ICD-10-CM

## 2018-03-27 DIAGNOSIS — Z01419 Encounter for gynecological examination (general) (routine) without abnormal findings: Secondary | ICD-10-CM | POA: Diagnosis not present

## 2018-03-27 NOTE — Progress Notes (Signed)
    Alicia Gray March 19, 1966 [id number]        52 y.o.  G3P0030 for annual gynecologic exam.  Doing well without gynecologic complaints  Past medical history,surgical history, problem list, medications, allergies, family history and social history were all reviewed and documented as reviewed in the EPIC chart.  ROS:  Performed with pertinent positives and negatives included in the history, assessment and plan.   Additional significant findings : None   Exam: Caryn Bee assistant Vitals:   03/27/18 0841  BP: 124/80  Weight: 179 lb (81.2 kg)  Height: 5' 1.5" (1.562 m)   Body mass index is 33.27 kg/m.  General appearance:  Normal affect, orientation and appearance. Skin: Grossly normal HEENT: Without gross lesions.  No cervical or supraclavicular adenopathy. Thyroid normal.  Lungs:  Clear without wheezing, rales or rhonchi Cardiac: RR, without RMG Abdominal:  Soft, nontender, without masses, guarding, rebound, organomegaly or hernia Breasts:  Examined lying and sitting without masses, retractions, discharge or axillary adenopathy. Pelvic:  Ext, BUS, Vagina: With atrophic changes  Adnexa: Without masses or tenderness    Anus and perineum: Normal   Rectovaginal: Normal sphincter tone without palpated masses or tenderness.    Assessment/Plan:  52 y.o. G50P0030 female for annual gynecologic exam.  Status post TAH in the past for leiomyoma  1. Postmenopausal.  No significant menopausal symptoms. 2. History of small left probable hydrosalpinx followed for years unchanged with ultrasounds and negative CA 125's.  We have elected to stop screening.  Her exam today is normal.  She is having no pain.  Continue with annual exam surveillance. 3. Mammography 01/2018.  Continue with annual mammography when due.  Breast exam normal today. 4. Pap smear/HPV 03/2014.  No Pap smear done today.  No history of significant abnormal Pap smears.  We have discussed the options of stop screening per  current screening guidelines based on hysterectomy history versus less frequent screening intervals.  Will readdress next year when she will be due for Pap smear/HPV. 5. Colonoscopy 2017.  Repeat at their recommended interval. 6. DEXA never.  Will plan further into the menopause. 7. Health maintenance.  No routine lab work done as patient does this elsewhere.  Follow-up 1 year, sooner as needed.   Anastasio Auerbach MD, 9:03 AM 03/27/2018

## 2018-03-27 NOTE — Patient Instructions (Signed)
Follow-up in 1 year for annual exam, sooner if any issues. 

## 2018-03-31 MED FILL — OSELTAMIVIR PHOSPHATE 75 MG: 75 | 5 days supply | Qty: 10 | Fill #0

## 2018-03-31 MED FILL — AZITHROMYCIN 250 MG TABLET: 250 | 5 days supply | Qty: 6 | Fill #0

## 2018-03-31 MED FILL — VIT D2 1.25 MG (50,000 UNIT: 1.25 MG | 84 days supply | Qty: 12 | Fill #0

## 2018-04-17 MED FILL — metFORMIN HCL 500 MG TABS: 500 | 30 days supply | Qty: 90 | Fill #1

## 2018-04-30 MED FILL — JARDIANCE 25 MG TABLET: 25 | 30 days supply | Qty: 30 | Fill #0

## 2018-05-19 MED FILL — LOSARTAN POTASSIUM 25 MG TA: 25 | 90 days supply | Qty: 90 | Fill #0

## 2018-06-02 MED FILL — JARDIANCE 25 MG TABLET: 25 | 30 days supply | Qty: 30 | Fill #1 | Status: TO

## 2018-06-02 MED FILL — ATORVASTATIN 10 MG TABLET: 10 | 90 days supply | Qty: 90 | Fill #0 | Status: TO

## 2018-06-02 MED FILL — metFORMIN HCL 500 MG TABS: 500 | 30 days supply | Qty: 90 | Fill #2 | Status: TO

## 2018-06-23 MED FILL — VIT D2 1.25 MG (50,000 UNIT: 1.25 MG | 84 days supply | Qty: 12 | Fill #1 | Status: TO

## 2018-07-01 MED FILL — metFORMIN HCL 500 MG TABS: 500 | 30 days supply | Qty: 90 | Fill #0

## 2018-07-09 MED FILL — DOXYCYCLINE HYCLATE 100 MG: 100 | 7 days supply | Qty: 14 | Fill #0

## 2018-08-15 MED FILL — LOSARTAN POTASSIUM 25 MG TA: 25 | 30 days supply | Qty: 30 | Fill #0

## 2018-08-19 MED FILL — metFORMIN HCL 500 MG TABS: 500 | 30 days supply | Qty: 90 | Fill #0

## 2018-08-19 MED FILL — ATORVASTATIN 10 MG TABLET: 10 | 90 days supply | Qty: 90 | Fill #0

## 2018-09-15 MED FILL — LOSARTAN POTASSIUM 25 MG TA: 25 | 30 days supply | Qty: 30 | Fill #1

## 2018-09-15 MED FILL — VIT D2 1.25 MG (50,000 UNIT: 1.25 MG | 84 days supply | Qty: 12 | Fill #0

## 2018-09-15 MED FILL — JARDIANCE 25 MG TABLET: 25 | 30 days supply | Qty: 30 | Fill #0

## 2018-10-10 MED FILL — LOSARTAN POTASSIUM 25 MG TA: 25 | 30 days supply | Qty: 30 | Fill #0

## 2018-10-10 MED FILL — metFORMIN HCL 500 MG TABS: 500 | 30 days supply | Qty: 90 | Fill #0

## 2018-10-13 MED FILL — ACCU-CHEK GUIDE TEST STRIP: 50 days supply | Qty: 100 | Fill #0

## 2018-10-30 MED FILL — JARDIANCE 25 MG TABLET: 25 | 90 days supply | Qty: 90 | Fill #0

## 2018-10-30 MED FILL — PANTOPRAZOLE SOD DR 40 MG T: 40 | 90 days supply | Qty: 90 | Fill #0

## 2018-11-17 MED FILL — LOSARTAN POTASSIUM 25 MG TA: 25 | 30 days supply | Qty: 30 | Fill #1

## 2018-12-01 MED FILL — metFORMIN HCL 500 MG TABS: 500 | 30 days supply | Qty: 90 | Fill #1

## 2018-12-01 MED FILL — VIT D2 1.25 MG (50,000 UNIT: 1.25 MG | 84 days supply | Qty: 12 | Fill #0

## 2018-12-16 MED FILL — LOSARTAN POTASSIUM 25 MG TA: 25 | 90 days supply | Qty: 90 | Fill #2

## 2018-12-16 MED FILL — ATORVASTATIN 10 MG TABLET: 10 | 90 days supply | Qty: 90 | Fill #0

## 2018-12-31 ENCOUNTER — Encounter: Payer: Self-pay | Admitting: Gynecology

## 2019-01-01 MED FILL — DOXYCYCLINE HYCLATE 100 MG: 100 | 7 days supply | Qty: 14 | Fill #0

## 2019-01-19 MED FILL — metFORMIN HCL 500 MG TABS: 500 | 30 days supply | Qty: 90 | Fill #0

## 2019-02-02 ENCOUNTER — Encounter: Payer: Self-pay | Admitting: Gynecology

## 2019-02-23 MED FILL — JARDIANCE 25 MG TABLET: 25 | 90 days supply | Qty: 90 | Fill #1

## 2019-02-23 MED FILL — metFORMIN HCL 500 MG TABS: 500 | 30 days supply | Qty: 90 | Fill #1

## 2019-02-23 MED FILL — ACCU-CHEK GUIDE TEST STRIP: 50 days supply | Qty: 100 | Fill #1

## 2019-03-02 MED FILL — VIT D2 1.25 MG (50,000 UNIT: 1.25 MG | 84 days supply | Qty: 12 | Fill #0

## 2019-03-02 MED FILL — SM ALCOHOL 70% PREP PADS: 90 days supply | Qty: 200 | Fill #0

## 2019-03-19 MED FILL — ATORVASTATIN 10 MG TABLET: 10 | 90 days supply | Qty: 90 | Fill #1

## 2019-03-19 MED FILL — LOSARTAN POTASSIUM 25 MG TA: 25 | 60 days supply | Qty: 60 | Fill #3

## 2019-04-01 ENCOUNTER — Other Ambulatory Visit: Payer: Self-pay

## 2019-04-01 ENCOUNTER — Encounter: Payer: Self-pay | Admitting: Gynecology

## 2019-04-01 ENCOUNTER — Ambulatory Visit (INDEPENDENT_AMBULATORY_CARE_PROVIDER_SITE_OTHER): Payer: No Typology Code available for payment source | Admitting: Gynecology

## 2019-04-01 VITALS — BP 126/76 | Ht 61.0 in | Wt 161.0 lb

## 2019-04-01 DIAGNOSIS — Z01419 Encounter for gynecological examination (general) (routine) without abnormal findings: Secondary | ICD-10-CM

## 2019-04-01 NOTE — Addendum Note (Signed)
Addended by: Nelva Nay on: 04/01/2019 09:32 AM   Modules accepted: Orders

## 2019-04-01 NOTE — Patient Instructions (Signed)
Follow-up in 1 year for annual exam, sooner if any issues. 

## 2019-04-01 NOTE — Progress Notes (Signed)
    DON RUSCHAK [birth year] [id number]        53 y.o.  G3P0030 for annual gynecologic exam.  Without gynecologic complaints  Past medical history,surgical history, problem list, medications, allergies, family history and social history were all reviewed and documented as reviewed in the EPIC chart.  ROS:  Performed with pertinent positives and negatives included in the history, assessment and plan.   Additional significant findings : None   Exam: Caryn Bee assistant Vitals:   04/01/19 0823  BP: 126/76  Weight: 161 lb (73 kg)  Height: 5\' 1"  (1.549 m)   Body mass index is 30.42 kg/m.  General appearance:  Normal affect, orientation and appearance. Skin: Grossly normal HEENT: Without gross lesions.  No cervical or supraclavicular adenopathy. Thyroid normal.  Lungs:  Clear without wheezing, rales or rhonchi Cardiac: RR, without RMG Abdominal:  Soft, nontender, without masses, guarding, rebound, organomegaly or hernia Breasts:  Examined lying and sitting without masses, retractions, discharge or axillary adenopathy. Pelvic:  Ext, BUS, Vagina: Normal with atrophic changes.  Pap smear of vaginal cuff done  Adnexa: Without masses or tenderness    Anus and perineum: Normal   Rectovaginal: Normal sphincter tone without palpated masses or tenderness.    Assessment/Plan:  53 y.o. G53P0030 female for annual gynecologic exam.  Status post TAH for leiomyoma  1. Postmenopausal.  No significant menopausal symptoms. 2. Mammography 01/2019.  Continue with annual mammography when due.  Breast exam normal today. 3. Colonoscopy 2017.  Repeat at their recommended interval. 4. Pap smear/HPV 2015.  Pap smear done today.  No history of significant abnormal Pap smears.  Options to stop screening per current screening guidelines reviewed.  Will readdress on an annual basis. 5. DEXA never.  Will plan further into the menopause. 6. Health maintenance.  No routine lab work done as patient does this  elsewhere.  Follow-up 1 year, sooner as needed.   Anastasio Auerbach MD, 8:49 AM 04/01/2019

## 2019-04-02 LAB — PAP IG W/ RFLX HPV ASCU

## 2019-04-06 MED FILL — ACCU-CHEK GUIDE TEST STRIP: 90 days supply | Qty: 200 | Fill #2

## 2019-04-20 MED FILL — metFORMIN HCL 500 MG TABS: 500 | 30 days supply | Qty: 90 | Fill #2

## 2019-05-22 MED FILL — LOSARTAN POTASSIUM 25 MG TA: 25 | 90 days supply | Qty: 90 | Fill #0

## 2019-05-22 MED FILL — VIT D2 1.25 MG (50,000 UNIT: 1.25 MG | 84 days supply | Qty: 12 | Fill #1

## 2019-06-01 MED FILL — JARDIANCE 25 MG TABLET: 25 | 90 days supply | Qty: 90 | Fill #2

## 2019-06-01 MED FILL — metFORMIN HCL 500 MG TABS: 500 | 30 days supply | Qty: 90 | Fill #3

## 2019-06-16 MED FILL — ATORVASTATIN 10 MG TABLET: 10 | 90 days supply | Qty: 90 | Fill #0

## 2019-07-27 MED FILL — metFORMIN HCL 500 MG TABS: 500 | 30 days supply | Qty: 90 | Fill #0

## 2019-09-01 MED FILL — JARDIANCE 25 MG TABLET: 25 | 90 days supply | Qty: 90 | Fill #3

## 2019-09-01 MED FILL — LOSARTAN POTASSIUM 25 MG TA: 25 | 90 days supply | Qty: 90 | Fill #1

## 2019-09-03 ENCOUNTER — Other Ambulatory Visit (HOSPITAL_COMMUNITY): Payer: Self-pay | Admitting: Internal Medicine

## 2019-09-09 MED FILL — IBUPROFEN 600 MG TABLET: 600 | 5 days supply | Qty: 30 | Fill #0

## 2019-10-22 MED FILL — VIT D2 1.25 MG (50,000 UNIT: 1.25 MG | 84 days supply | Qty: 12 | Fill #2

## 2019-10-30 MED FILL — METFORMIN HCL 500 MG TABS: 500 | 30 days supply | Qty: 90 | Fill #2

## 2019-11-02 ENCOUNTER — Other Ambulatory Visit (HOSPITAL_COMMUNITY): Payer: Self-pay | Admitting: Internal Medicine

## 2019-11-02 MED FILL — FREESTYLE LITE TEST STRIP: 50 days supply | Qty: 100 | Fill #0

## 2019-11-03 MED FILL — FREESTYLE LANCETS: 50 days supply | Qty: 100 | Fill #0

## 2019-11-03 MED FILL — FREESTYLE LITE METER: 1 days supply | Qty: 1 | Fill #0

## 2019-11-30 ENCOUNTER — Other Ambulatory Visit (HOSPITAL_COMMUNITY): Payer: Self-pay | Admitting: Internal Medicine

## 2019-11-30 MED FILL — JARDIANCE 25 MG TABLET: 25 | 90 days supply | Qty: 90 | Fill #0

## 2019-11-30 MED FILL — LOSARTAN POTASSIUM 25 MG TA: 25 | 90 days supply | Qty: 90 | Fill #0

## 2019-12-15 MED FILL — METFORMIN HCL 500 MG TABS: 500 | 30 days supply | Qty: 90 | Fill #3

## 2019-12-28 MED FILL — ATORVASTATIN CALCIUM 10 MG: 10 | 90 days supply | Qty: 90 | Fill #2

## 2020-02-01 ENCOUNTER — Other Ambulatory Visit (HOSPITAL_COMMUNITY): Payer: Self-pay | Admitting: Internal Medicine

## 2020-02-01 MED FILL — FREESTYLE LITE TEST STRIP: 50 days supply | Qty: 100 | Fill #1

## 2020-02-01 MED FILL — METFORMIN HCL 500 MG TABS: 500 | 30 days supply | Qty: 90 | Fill #0

## 2020-02-23 ENCOUNTER — Other Ambulatory Visit (HOSPITAL_COMMUNITY): Payer: Self-pay | Admitting: Internal Medicine

## 2020-02-23 MED FILL — CIPROFLOXACIN HCL 500 MG TA: 500 | 5 days supply | Qty: 10 | Fill #0

## 2020-03-01 ENCOUNTER — Other Ambulatory Visit (HOSPITAL_COMMUNITY): Payer: Self-pay | Admitting: Internal Medicine

## 2020-03-01 MED FILL — VIT D2 1.25 MG (50,000 UNIT: 1.25 MG | 84 days supply | Qty: 12 | Fill #0

## 2020-03-01 MED FILL — LOSARTAN POTASSIUM 25 MG TA: 25 | 30 days supply | Qty: 30 | Fill #1

## 2020-03-01 MED FILL — JARDIANCE 25 MG TABLET: 25 | 90 days supply | Qty: 90 | Fill #1

## 2020-03-14 MED FILL — METFORMIN HCL 500 MG TABS: 500 | 30 days supply | Qty: 90 | Fill #1

## 2020-03-28 ENCOUNTER — Other Ambulatory Visit (HOSPITAL_COMMUNITY): Payer: Self-pay | Admitting: Internal Medicine

## 2020-03-28 MED FILL — ATORVASTATIN CALCIUM 10 MG: 10 | 90 days supply | Qty: 90 | Fill #3

## 2020-03-28 MED FILL — LOSARTAN POTASSIUM 25 MG TA: 25 | 30 days supply | Qty: 30 | Fill #0

## 2020-04-04 ENCOUNTER — Other Ambulatory Visit: Payer: Self-pay

## 2020-04-04 ENCOUNTER — Encounter: Payer: Self-pay | Admitting: Obstetrics and Gynecology

## 2020-04-04 ENCOUNTER — Ambulatory Visit (INDEPENDENT_AMBULATORY_CARE_PROVIDER_SITE_OTHER): Payer: No Typology Code available for payment source | Admitting: Obstetrics and Gynecology

## 2020-04-04 VITALS — BP 140/86 | Ht 61.0 in | Wt 164.0 lb

## 2020-04-04 DIAGNOSIS — Z01419 Encounter for gynecological examination (general) (routine) without abnormal findings: Secondary | ICD-10-CM | POA: Diagnosis not present

## 2020-04-04 NOTE — Progress Notes (Signed)
   Alicia Gray 21-Aug-1965 [id number]  SUBJECTIVE:  54 y.o. G68P0030 female for annual routine gynecologic exam. She has no gynecologic concerns.  Current Outpatient Medications  Medication Sig Dispense Refill  . atorvastatin (LIPITOR) 10 MG tablet Take 10 mg by mouth daily.    . calcium-vitamin D (OSCAL WITH D) 500-200 MG-UNIT per tablet Take 1 tablet by mouth daily.    . Empagliflozin (JARDIANCE PO) Take by mouth.    . fluticasone (FLONASE) 50 MCG/ACT nasal spray Place 2 sprays into both nostrils daily as needed for allergies or rhinitis.    Marland Kitchen loratadine (CLARITIN) 10 MG tablet Take 10 mg by mouth daily as needed.     Marland Kitchen losartan (COZAAR) 25 MG tablet Take 25 mg by mouth daily.    . metFORMIN (GLUCOPHAGE) 500 MG tablet Take 500 mg by mouth 2 (two) times daily with a meal.    . Multiple Vitamin (MULTIVITAMIN) capsule Take 1 capsule by mouth daily.     No current facility-administered medications for this visit.   Allergies: Aspirin, Bee venom, Penicillins, and Sulfonamide derivatives  Patient's last menstrual period was 03/04/2001.  Past medical history,surgical history, problem list, medications, allergies, family history and social history were all reviewed and documented as reviewed in the EPIC chart.  ROS: Pertinent positives and negatives as reviewed in HPI   OBJECTIVE:  BP 140/86   Ht 5\' 1"  (1.549 m)   Wt 164 lb (74.4 kg)   LMP 03/04/2001   BMI 30.99 kg/m  The patient appears well, alert, oriented, in no distress. ENT normal.  Neck supple. No cervical or supraclavicular adenopathy or thyromegaly.  Lungs are clear, good air entry, no wheezes, rhonchi or rales. S1 and S2 normal, no murmurs, regular rate and rhythm.  Abdomen soft without tenderness, guarding, mass or organomegaly.  Neurological is normal, no focal findings.  BREAST EXAM: breasts appear normal, no suspicious masses, no skin or nipple changes or axillary nodes  PELVIC EXAM: VULVA: normal appearing vulva  with atrophic change, no masses, tenderness or lesions, VAGINA: normal appearing vagina with atrophic change, normal color and discharge, no lesions, CERVIX: surgically absent, UTERUS: surgically absent, vaginal cuff normal, ADNEXA: no masses, nontender  Chaperone: 03/06/2001 present during the examination  ASSESSMENT:  54 y.o. G3P0030 here for annual gynecologic exam  PLAN:   1. Postmenopausal. Prior TAH for leiomyoma.  No significant menopausal symptoms. 2. Pap smear 08/2018.  No significant history of abnormal Pap smears.  Next Pap smear due 2023 following the current guidelines recommending the 3 year interval. 3. Mammogram reported 02/2020.  We will look for the paper record as it probably has not been scanned in yet.  Normal breast exam today.  She will continue with annual mammograms. 4. Colonoscopy 2017.  Recommended that she follow up at the recommended interval.  5. DEXA never.  Plan when further into menopause.. 6. Health maintenance.  No labs today as she normally has these completed elsewhere.  Return annually or sooner, prn.  2018 MD 04/04/20

## 2020-05-02 MED FILL — METFORMIN HCL 500 MG TABS: 500 | 30 days supply | Qty: 90 | Fill #2

## 2020-05-02 MED FILL — LOSARTAN POTASSIUM 25 MG TA: 25 | 30 days supply | Qty: 30 | Fill #1

## 2020-05-02 MED FILL — FREESTYLE LITE TEST STRIP: 50 days supply | Qty: 100 | Fill #2

## 2020-06-06 MED FILL — LOSARTAN POTASSIUM 25 MG TA: 25 | 30 days supply | Qty: 30 | Fill #2

## 2020-06-06 MED FILL — JARDIANCE 25 MG TABLET: 25 | 90 days supply | Qty: 90 | Fill #2

## 2020-06-21 ENCOUNTER — Other Ambulatory Visit (HOSPITAL_COMMUNITY): Payer: Self-pay | Admitting: Internal Medicine

## 2020-07-04 MED FILL — LOSARTAN POTASSIUM 25 MG TA: 25 | 30 days supply | Qty: 30 | Fill #3

## 2020-08-10 ENCOUNTER — Other Ambulatory Visit: Payer: Self-pay | Admitting: Internal Medicine

## 2020-08-10 MED FILL — Ergocalciferol Cap 1.25 MG (50000 Unit): ORAL | 84 days supply | Qty: 12 | Fill #0 | Status: AC

## 2020-08-11 ENCOUNTER — Other Ambulatory Visit (HOSPITAL_COMMUNITY): Payer: Self-pay

## 2020-08-11 ENCOUNTER — Other Ambulatory Visit: Payer: Self-pay | Admitting: Internal Medicine

## 2020-08-11 MED FILL — Glucose Blood Test Strip: 50 days supply | Qty: 100 | Fill #0 | Status: AC

## 2020-08-12 ENCOUNTER — Other Ambulatory Visit (HOSPITAL_COMMUNITY): Payer: Self-pay

## 2020-08-12 ENCOUNTER — Other Ambulatory Visit: Payer: Self-pay | Admitting: Internal Medicine

## 2020-08-15 ENCOUNTER — Other Ambulatory Visit (HOSPITAL_COMMUNITY): Payer: Self-pay

## 2020-08-16 ENCOUNTER — Other Ambulatory Visit (HOSPITAL_COMMUNITY): Payer: Self-pay

## 2020-08-17 ENCOUNTER — Other Ambulatory Visit (HOSPITAL_COMMUNITY): Payer: Self-pay

## 2020-08-17 MED ORDER — METFORMIN HCL 500 MG PO TABS
1500.0000 mg | ORAL_TABLET | Freq: Every day | ORAL | 3 refills | Status: DC
  Filled 2020-08-17: qty 90, 30d supply, fill #0
  Filled 2020-10-03: qty 90, 30d supply, fill #1
  Filled 2020-11-08: qty 90, 30d supply, fill #2
  Filled 2020-12-28: qty 90, 30d supply, fill #3

## 2020-08-17 MED ORDER — LOSARTAN POTASSIUM 25 MG PO TABS
25.0000 mg | ORAL_TABLET | Freq: Every day | ORAL | 3 refills | Status: DC
  Filled 2020-08-17: qty 90, 90d supply, fill #0
  Filled 2020-11-21: qty 90, 90d supply, fill #1
  Filled 2021-02-20: qty 90, 90d supply, fill #2
  Filled 2021-05-28: qty 90, 90d supply, fill #3

## 2020-08-17 MED ORDER — FLUTICASONE PROPIONATE 50 MCG/ACT NA SUSP
2.0000 | Freq: Every day | NASAL | 6 refills | Status: DC | PRN
  Filled 2020-08-17: qty 16, 30d supply, fill #0

## 2020-09-23 ENCOUNTER — Other Ambulatory Visit (HOSPITAL_COMMUNITY): Payer: Self-pay

## 2020-09-23 MED FILL — Empagliflozin Tab 25 MG: ORAL | 90 days supply | Qty: 90 | Fill #0 | Status: AC

## 2020-09-29 ENCOUNTER — Other Ambulatory Visit (HOSPITAL_COMMUNITY): Payer: Self-pay

## 2020-09-29 MED ORDER — ACETAMINOPHEN-CODEINE #3 300-30 MG PO TABS
1.0000 | ORAL_TABLET | ORAL | 1 refills | Status: DC
  Filled 2020-09-29: qty 15, 3d supply, fill #0

## 2020-10-03 ENCOUNTER — Other Ambulatory Visit (HOSPITAL_COMMUNITY): Payer: Self-pay

## 2020-10-14 ENCOUNTER — Other Ambulatory Visit (HOSPITAL_COMMUNITY): Payer: Self-pay

## 2020-10-17 ENCOUNTER — Other Ambulatory Visit (HOSPITAL_COMMUNITY): Payer: Self-pay

## 2020-10-17 MED FILL — Atorvastatin Calcium Tab 10 MG (Base Equivalent): ORAL | 90 days supply | Qty: 90 | Fill #0 | Status: AC

## 2020-10-25 ENCOUNTER — Other Ambulatory Visit (HOSPITAL_COMMUNITY): Payer: Self-pay

## 2020-10-25 MED ORDER — JARDIANCE 25 MG PO TABS
25.0000 mg | ORAL_TABLET | Freq: Every morning | ORAL | 4 refills | Status: DC
  Filled 2020-10-25 – 2020-12-28 (×2): qty 90, 90d supply, fill #0
  Filled 2021-04-01: qty 90, 90d supply, fill #1
  Filled 2021-07-10 (×2): qty 90, 90d supply, fill #2
  Filled 2021-10-05: qty 90, 90d supply, fill #3

## 2020-11-01 ENCOUNTER — Other Ambulatory Visit (HOSPITAL_COMMUNITY): Payer: Self-pay

## 2020-11-01 ENCOUNTER — Other Ambulatory Visit: Payer: Self-pay

## 2020-11-01 MED ORDER — FREESTYLE LITE TEST VI STRP
ORAL_STRIP | 3 refills | Status: DC
  Filled 2020-11-01: qty 50, 50d supply, fill #0
  Filled 2020-12-22: qty 50, 50d supply, fill #1

## 2020-11-08 ENCOUNTER — Other Ambulatory Visit (HOSPITAL_COMMUNITY): Payer: Self-pay

## 2020-11-08 MED FILL — Ergocalciferol Cap 1.25 MG (50000 Unit): ORAL | 84 days supply | Qty: 12 | Fill #1 | Status: AC

## 2020-11-09 ENCOUNTER — Other Ambulatory Visit (HOSPITAL_COMMUNITY): Payer: Self-pay

## 2020-11-21 ENCOUNTER — Other Ambulatory Visit: Payer: Self-pay

## 2020-11-21 ENCOUNTER — Other Ambulatory Visit (HOSPITAL_COMMUNITY): Payer: Self-pay

## 2020-11-22 ENCOUNTER — Other Ambulatory Visit (HOSPITAL_COMMUNITY): Payer: Self-pay

## 2020-11-22 MED ORDER — FREESTYLE LITE TEST VI STRP
ORAL_STRIP | 3 refills | Status: AC
  Filled 2020-11-22: qty 50, 50d supply, fill #0

## 2020-12-22 ENCOUNTER — Other Ambulatory Visit (HOSPITAL_COMMUNITY): Payer: Self-pay

## 2020-12-22 MED ORDER — GLUCOSE BLOOD VI STRP
ORAL_STRIP | 2 refills | Status: DC
  Filled 2020-12-22: qty 100, 50d supply, fill #0
  Filled 2021-04-04: qty 100, 50d supply, fill #1
  Filled 2021-07-23: qty 100, 50d supply, fill #2

## 2020-12-22 MED ORDER — GLUCOSE BLOOD VI STRP
ORAL_STRIP | 2 refills | Status: DC
  Filled 2020-12-22: qty 50, 50d supply, fill #0

## 2020-12-29 ENCOUNTER — Other Ambulatory Visit (HOSPITAL_COMMUNITY): Payer: Self-pay

## 2021-01-18 MED FILL — Atorvastatin Calcium Tab 10 MG (Base Equivalent): ORAL | 90 days supply | Qty: 90 | Fill #1 | Status: AC

## 2021-01-19 ENCOUNTER — Other Ambulatory Visit (HOSPITAL_COMMUNITY): Payer: Self-pay

## 2021-02-20 ENCOUNTER — Other Ambulatory Visit (HOSPITAL_COMMUNITY): Payer: Self-pay

## 2021-02-21 ENCOUNTER — Other Ambulatory Visit (HOSPITAL_COMMUNITY): Payer: Self-pay

## 2021-02-21 MED ORDER — METFORMIN HCL 500 MG PO TABS
ORAL_TABLET | ORAL | 3 refills | Status: DC
  Filled 2021-02-21: qty 90, 30d supply, fill #0
  Filled 2021-04-16: qty 90, 30d supply, fill #1
  Filled 2021-05-28: qty 90, 30d supply, fill #2
  Filled 2021-07-10: qty 90, 30d supply, fill #3

## 2021-03-02 ENCOUNTER — Other Ambulatory Visit: Payer: Self-pay

## 2021-03-03 ENCOUNTER — Other Ambulatory Visit (HOSPITAL_COMMUNITY): Payer: Self-pay

## 2021-03-06 ENCOUNTER — Other Ambulatory Visit (HOSPITAL_COMMUNITY): Payer: Self-pay

## 2021-03-06 MED ORDER — VITAMIN D (ERGOCALCIFEROL) 1.25 MG (50000 UNIT) PO CAPS
50000.0000 [IU] | ORAL_CAPSULE | ORAL | 4 refills | Status: DC
  Filled 2021-03-06: qty 12, 84d supply, fill #0
  Filled 2021-05-28: qty 12, 84d supply, fill #1
  Filled 2021-08-23: qty 12, 84d supply, fill #2
  Filled 2021-11-15: qty 12, 84d supply, fill #3
  Filled 2022-02-02: qty 12, 84d supply, fill #4

## 2021-03-16 ENCOUNTER — Other Ambulatory Visit (HOSPITAL_COMMUNITY): Payer: Self-pay

## 2021-03-16 MED ORDER — PEG 3350-KCL-NA BICARB-NACL 420 G PO SOLR
ORAL | 0 refills | Status: DC
  Filled 2021-03-16: qty 4000, 1d supply, fill #0

## 2021-04-04 ENCOUNTER — Other Ambulatory Visit (HOSPITAL_COMMUNITY): Payer: Self-pay

## 2021-04-05 ENCOUNTER — Other Ambulatory Visit (HOSPITAL_COMMUNITY): Payer: Self-pay

## 2021-04-05 ENCOUNTER — Ambulatory Visit: Payer: Self-pay | Admitting: Obstetrics & Gynecology

## 2021-04-06 ENCOUNTER — Encounter: Payer: Self-pay | Admitting: Nurse Practitioner

## 2021-04-06 ENCOUNTER — Other Ambulatory Visit: Payer: Self-pay

## 2021-04-06 ENCOUNTER — Ambulatory Visit (INDEPENDENT_AMBULATORY_CARE_PROVIDER_SITE_OTHER): Payer: No Typology Code available for payment source | Admitting: Nurse Practitioner

## 2021-04-06 VITALS — BP 122/82 | HR 78 | Ht 62.0 in | Wt 161.0 lb

## 2021-04-06 DIAGNOSIS — Z780 Asymptomatic menopausal state: Secondary | ICD-10-CM | POA: Diagnosis not present

## 2021-04-06 DIAGNOSIS — Z01419 Encounter for gynecological examination (general) (routine) without abnormal findings: Secondary | ICD-10-CM | POA: Diagnosis not present

## 2021-04-06 NOTE — Progress Notes (Signed)
° °  Alicia Gray 04/22/1965 [id number]   History:  55 y.o. G3P0030 presents for annual exam. Postmenopausal - no HRT. S/P TAH in 30s for fibroid. Normal pap history. HTN, DM.   Gynecologic History Patient's last menstrual period was 03/04/2001.   Contraception/Family planning: status post hysterectomy Sexually active: Yes  Health Maintenance Last Pap: 04/01/2019. Results were: Normal Last mammogram: 02/13/2021. Results were: Normal per patient Last colonoscopy: 03/21/2021. Results were: Benign polyp, 10-year recall Last Dexa: Not indicated  Past medical history, past surgical history, family history and social history were all reviewed and documented in the EPIC chart. 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   ROS:  A ROS was performed and pertinent positives and negatives are included.  Exam:  Vitals:   04/06/21 0850  BP: 122/82  Pulse: 78  SpO2: 99%  Weight: 161 lb (73 kg)  Height: 5\' 2"  (1.575 m)   Body mass index is 29.45 kg/m.  General appearance:  Normal Thyroid:  Symmetrical, normal in size, without palpable masses or nodularity. Respiratory  Auscultation:  Clear without wheezing or rhonchi Cardiovascular  Auscultation:  Regular rate, without rubs, murmurs or gallops  Edema/varicosities:  Not grossly evident Abdominal  Soft,nontender, without masses, guarding or rebound.  Liver/spleen:  No organomegaly noted  Hernia:  None appreciated  Skin  Inspection:  Grossly normal Breasts: Examined lying and sitting.   Right: Without masses, retractions, nipple discharge or axillary adenopathy.   Left: Without masses, retractions, nipple discharge or axillary adenopathy. Genitourinary   Inguinal/mons:  Normal without inguinal adenopathy  External genitalia:  Normal appearing vulva with no masses, tenderness, or lesions  BUS/Urethra/Skene's glands:  Normal  Vagina:  Normal appearing with normal color and discharge, no lesions  Cervix:  Absent  Uterus:   Absent  Adnexa/parametria:     Rt: Normal in size, without masses or tenderness.   Lt: Normal in size, without masses or tenderness.  Anus and perineum: Normal  Digital rectal exam: Deferred d/t recent colonoscopy  Patient informed chaperone available to be present for breast and pelvic exam. Patient has requested no chaperone to be present. Patient has been advised what will be completed during breast and pelvic exam.   Assessment/Plan:  55 y.o. G3P0030 for annual exam.   Well female exam with routine gynecological exam - Education provided on SBEs, importance of preventative screenings, current guidelines, high calcium diet, regular exercise, and multivitamin daily. Labs with PCP.   Postmenopausal - no HRT. S/P TAH in her 55s for fibroids.   Screening for cervical cancer - Normal Pap history. No longer screening per guidelines.  Screening for breast cancer - Normal mammogram history.  Continue annual screenings.  Normal breast exam today.  Screening for colon cancer - 03/2021 colonoscopy. Will repeat at 10-year interval per GI's recommendation.   Screening for osteoporosis - Average risk. Will plan DXA at age 55.   Return in 1 year for annual.   Tamela Gammon DNP, 9:17 AM 04/06/2021

## 2021-04-16 MED FILL — Atorvastatin Calcium Tab 10 MG (Base Equivalent): ORAL | 90 days supply | Qty: 90 | Fill #2 | Status: AC

## 2021-04-17 ENCOUNTER — Other Ambulatory Visit (HOSPITAL_COMMUNITY): Payer: Self-pay

## 2021-05-29 ENCOUNTER — Other Ambulatory Visit (HOSPITAL_COMMUNITY): Payer: Self-pay

## 2021-06-07 ENCOUNTER — Other Ambulatory Visit (HOSPITAL_COMMUNITY): Payer: Self-pay

## 2021-06-07 MED ORDER — CLINDAMYCIN HCL 150 MG PO CAPS
150.0000 mg | ORAL_CAPSULE | Freq: Three times a day (TID) | ORAL | 0 refills | Status: DC
  Filled 2021-06-07: qty 27, 9d supply, fill #0

## 2021-07-10 ENCOUNTER — Other Ambulatory Visit (HOSPITAL_COMMUNITY): Payer: Self-pay

## 2021-07-11 ENCOUNTER — Other Ambulatory Visit (HOSPITAL_COMMUNITY): Payer: Self-pay

## 2021-07-23 ENCOUNTER — Other Ambulatory Visit: Payer: Self-pay

## 2021-07-24 ENCOUNTER — Other Ambulatory Visit (HOSPITAL_COMMUNITY): Payer: Self-pay

## 2021-07-25 ENCOUNTER — Other Ambulatory Visit: Payer: Self-pay

## 2021-07-25 ENCOUNTER — Other Ambulatory Visit (HOSPITAL_COMMUNITY): Payer: Self-pay

## 2021-07-26 ENCOUNTER — Other Ambulatory Visit (HOSPITAL_COMMUNITY): Payer: Self-pay

## 2021-07-26 MED ORDER — ATORVASTATIN CALCIUM 10 MG PO TABS
10.0000 mg | ORAL_TABLET | Freq: Every day | ORAL | 0 refills | Status: AC
Start: 2021-07-26 — End: ?
  Filled 2021-07-26: qty 90, 90d supply, fill #0

## 2021-07-31 ENCOUNTER — Other Ambulatory Visit (HOSPITAL_COMMUNITY): Payer: Self-pay

## 2021-08-23 ENCOUNTER — Other Ambulatory Visit (HOSPITAL_COMMUNITY): Payer: Self-pay

## 2021-08-24 ENCOUNTER — Other Ambulatory Visit (HOSPITAL_COMMUNITY): Payer: Self-pay

## 2021-08-24 MED ORDER — LOSARTAN POTASSIUM 25 MG PO TABS
25.0000 mg | ORAL_TABLET | Freq: Every day | ORAL | 3 refills | Status: DC
  Filled 2021-08-24: qty 90, 90d supply, fill #0
  Filled 2021-11-15: qty 90, 90d supply, fill #1
  Filled 2022-02-25: qty 90, 90d supply, fill #2
  Filled 2022-05-27: qty 90, 90d supply, fill #3

## 2021-08-24 MED ORDER — METFORMIN HCL 500 MG PO TABS
ORAL_TABLET | ORAL | 3 refills | Status: DC
Start: 2021-08-24 — End: 2022-02-26
  Filled 2021-08-24: qty 90, 30d supply, fill #0
  Filled 2021-10-05: qty 90, 30d supply, fill #1
  Filled 2021-11-15: qty 90, 30d supply, fill #2
  Filled 2022-01-08: qty 90, 30d supply, fill #3

## 2021-10-06 ENCOUNTER — Other Ambulatory Visit (HOSPITAL_COMMUNITY): Payer: Self-pay

## 2021-10-29 ENCOUNTER — Other Ambulatory Visit (HOSPITAL_COMMUNITY): Payer: Self-pay

## 2021-10-30 ENCOUNTER — Other Ambulatory Visit (HOSPITAL_COMMUNITY): Payer: Self-pay

## 2021-10-31 ENCOUNTER — Other Ambulatory Visit (HOSPITAL_COMMUNITY): Payer: Self-pay

## 2021-10-31 MED ORDER — ATORVASTATIN CALCIUM 10 MG PO TABS
10.0000 mg | ORAL_TABLET | Freq: Every day | ORAL | 0 refills | Status: DC
  Filled 2021-10-31: qty 90, 90d supply, fill #0

## 2021-11-01 ENCOUNTER — Other Ambulatory Visit (HOSPITAL_COMMUNITY): Payer: Self-pay

## 2021-11-01 ENCOUNTER — Other Ambulatory Visit: Payer: Self-pay

## 2021-11-01 MED ORDER — FREESTYLE LITE TEST VI STRP
ORAL_STRIP | 2 refills | Status: AC
  Filled 2021-11-01: qty 100, 50d supply, fill #0
  Filled 2022-02-28: qty 100, 50d supply, fill #1
  Filled 2022-04-04 – 2022-04-29 (×2): qty 100, 50d supply, fill #2

## 2021-11-07 ENCOUNTER — Other Ambulatory Visit (HOSPITAL_COMMUNITY): Payer: Self-pay

## 2021-11-07 MED ORDER — ATORVASTATIN CALCIUM 10 MG PO TABS
10.0000 mg | ORAL_TABLET | Freq: Every day | ORAL | 4 refills | Status: DC
  Filled 2021-11-07 – 2022-01-28 (×2): qty 90, 90d supply, fill #0
  Filled 2022-04-29: qty 90, 90d supply, fill #1
  Filled 2022-07-29: qty 90, 90d supply, fill #2
  Filled 2022-10-28: qty 90, 90d supply, fill #3

## 2021-11-07 MED ORDER — JARDIANCE 25 MG PO TABS
25.0000 mg | ORAL_TABLET | Freq: Every day | ORAL | 4 refills | Status: AC
  Filled 2021-11-07 – 2022-01-08 (×2): qty 90, 90d supply, fill #0
  Filled 2022-04-03: qty 90, 90d supply, fill #1
  Filled 2022-07-01: qty 90, 90d supply, fill #2
  Filled 2022-10-01: qty 90, 90d supply, fill #3

## 2021-11-16 ENCOUNTER — Other Ambulatory Visit (HOSPITAL_COMMUNITY): Payer: Self-pay

## 2022-01-08 ENCOUNTER — Other Ambulatory Visit (HOSPITAL_COMMUNITY): Payer: Self-pay

## 2022-01-11 ENCOUNTER — Other Ambulatory Visit (HOSPITAL_COMMUNITY): Payer: Self-pay

## 2022-01-29 ENCOUNTER — Other Ambulatory Visit (HOSPITAL_COMMUNITY): Payer: Self-pay

## 2022-02-02 ENCOUNTER — Other Ambulatory Visit (HOSPITAL_COMMUNITY): Payer: Self-pay

## 2022-02-25 ENCOUNTER — Other Ambulatory Visit (HOSPITAL_COMMUNITY): Payer: Self-pay

## 2022-02-26 ENCOUNTER — Other Ambulatory Visit (HOSPITAL_COMMUNITY): Payer: Self-pay

## 2022-02-26 MED ORDER — METFORMIN HCL 500 MG PO TABS
ORAL_TABLET | ORAL | 4 refills | Status: AC
  Filled 2022-02-26: qty 90, 30d supply, fill #0
  Filled 2022-04-03: qty 90, 30d supply, fill #1
  Filled 2022-05-27: qty 90, 30d supply, fill #2
  Filled 2022-07-01: qty 90, 30d supply, fill #3

## 2022-02-28 ENCOUNTER — Other Ambulatory Visit (HOSPITAL_COMMUNITY): Payer: Self-pay

## 2022-04-04 ENCOUNTER — Other Ambulatory Visit (HOSPITAL_COMMUNITY): Payer: Self-pay

## 2022-04-10 ENCOUNTER — Ambulatory Visit (INDEPENDENT_AMBULATORY_CARE_PROVIDER_SITE_OTHER): Payer: 59 | Admitting: Nurse Practitioner

## 2022-04-10 ENCOUNTER — Other Ambulatory Visit (HOSPITAL_COMMUNITY): Payer: Self-pay

## 2022-04-10 ENCOUNTER — Encounter: Payer: Self-pay | Admitting: Nurse Practitioner

## 2022-04-10 VITALS — BP 128/74 | HR 88 | Ht 61.0 in | Wt 171.0 lb

## 2022-04-10 DIAGNOSIS — Z780 Asymptomatic menopausal state: Secondary | ICD-10-CM

## 2022-04-10 DIAGNOSIS — Z01419 Encounter for gynecological examination (general) (routine) without abnormal findings: Secondary | ICD-10-CM

## 2022-04-10 NOTE — Progress Notes (Signed)
   GREGORIA SELVY [birth year] [id number]   History:  57 y.o. G3P0030 presents for annual exam. Postmenopausal - no HRT. S/P TAH in 30s for fibroids. Normal pap history. HTN, DM.   Gynecologic History Patient's last menstrual period was 03/04/2001.   Contraception/Family planning: status post hysterectomy Sexually active: Yes  Health Maintenance Last Pap: 04/01/2019. Results were: Normal Last mammogram: 02/19/2022. Results were: Normal per patient Last colonoscopy: 03/21/2021. Results were: Benign polyp, 10-year recall Last Dexa: Not indicated  Past medical history, past surgical history, family history and social history were all reviewed and documented in the EPIC chart. 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   ROS:  A ROS was performed and pertinent positives and negatives are included.  Exam:  Vitals:   04/10/22 0830  BP: 128/74  Pulse: 88  SpO2: 100%  Weight: 171 lb (77.6 kg)  Height: '5\' 1"'$  (1.549 m)    Body mass index is 32.31 kg/m.  General appearance:  Normal Thyroid:  Symmetrical, normal in size, without palpable masses or nodularity. Respiratory  Auscultation:  Clear without wheezing or rhonchi Cardiovascular  Auscultation:  Regular rate, without rubs, murmurs or gallops  Edema/varicosities:  Not grossly evident Abdominal  Soft,nontender, without masses, guarding or rebound.  Liver/spleen:  No organomegaly noted  Hernia:  None appreciated  Skin  Inspection:  Grossly normal Breasts: Examined lying and sitting.   Right: Without masses, retractions, nipple discharge or axillary adenopathy.   Left: Without masses, retractions, nipple discharge or axillary adenopathy. Genitourinary   Inguinal/mons:  Normal without inguinal adenopathy  External genitalia:  Normal appearing vulva with no masses, tenderness, or lesions  BUS/Urethra/Skene's glands:  Normal  Vagina:  Normal appearing with normal color and discharge, no lesions  Cervix:   Absent  Uterus:  Absent  Adnexa/parametria:     Rt: Normal in size, without masses or tenderness.   Lt: Normal in size, without masses or tenderness.  Anus and perineum: Normal  Digital rectal exam: Normal sphincter tone without palpated masses or tenderness  Patient informed chaperone available to be present for breast and pelvic exam. Patient has requested no chaperone to be present. Patient has been advised what will be completed during breast and pelvic exam.   Assessment/Plan:  57 y.o. G3P0030 for annual exam.   Well female exam with routine gynecological exam - Education provided on SBEs, importance of preventative screenings, current guidelines, high calcium diet, regular exercise, and multivitamin daily. Labs with PCP.   Postmenopausal - no HRT. S/P TAH in her 57s for fibroids.   Screening for cervical cancer - Normal Pap history. No longer screening per guidelines.  Screening for breast cancer - Normal mammogram history.  Continue annual screenings.  Normal breast exam today.  Screening for colon cancer - 03/2021 colonoscopy. Will repeat at 10-year interval per GI's recommendation.   Screening for osteoporosis - Average risk. Will plan DXA at age 57.   Return in 1 year for annual.     Tamela Gammon DNP, 8:46 AM 04/10/2022

## 2022-04-12 ENCOUNTER — Encounter: Payer: Self-pay | Admitting: Nurse Practitioner

## 2022-04-27 ENCOUNTER — Other Ambulatory Visit (HOSPITAL_COMMUNITY): Payer: Self-pay

## 2022-05-02 ENCOUNTER — Other Ambulatory Visit (HOSPITAL_COMMUNITY): Payer: Self-pay

## 2022-05-03 ENCOUNTER — Other Ambulatory Visit (HOSPITAL_COMMUNITY): Payer: Self-pay

## 2022-05-03 MED ORDER — VITAMIN D (ERGOCALCIFEROL) 1.25 MG (50000 UNIT) PO CAPS
50000.0000 [IU] | ORAL_CAPSULE | ORAL | 4 refills | Status: DC
  Filled 2022-05-03: qty 12, 84d supply, fill #0
  Filled 2022-07-26: qty 12, 84d supply, fill #1
  Filled 2022-10-10: qty 12, 84d supply, fill #2
  Filled 2023-01-10: qty 12, 84d supply, fill #3
  Filled 2023-03-21: qty 12, 84d supply, fill #4

## 2022-06-06 ENCOUNTER — Other Ambulatory Visit (HOSPITAL_COMMUNITY): Payer: Self-pay

## 2022-06-06 DIAGNOSIS — E559 Vitamin D deficiency, unspecified: Secondary | ICD-10-CM | POA: Diagnosis not present

## 2022-06-06 DIAGNOSIS — E782 Mixed hyperlipidemia: Secondary | ICD-10-CM | POA: Diagnosis not present

## 2022-06-06 DIAGNOSIS — E1169 Type 2 diabetes mellitus with other specified complication: Secondary | ICD-10-CM | POA: Diagnosis not present

## 2022-06-06 DIAGNOSIS — Z0000 Encounter for general adult medical examination without abnormal findings: Secondary | ICD-10-CM | POA: Diagnosis not present

## 2022-06-06 DIAGNOSIS — Z1389 Encounter for screening for other disorder: Secondary | ICD-10-CM | POA: Diagnosis not present

## 2022-06-06 DIAGNOSIS — I10 Essential (primary) hypertension: Secondary | ICD-10-CM | POA: Diagnosis not present

## 2022-06-06 DIAGNOSIS — K219 Gastro-esophageal reflux disease without esophagitis: Secondary | ICD-10-CM | POA: Diagnosis not present

## 2022-06-06 MED ORDER — LOSARTAN POTASSIUM 25 MG PO TABS
25.0000 mg | ORAL_TABLET | Freq: Every day | ORAL | 5 refills | Status: DC
  Filled 2022-06-06 – 2022-08-30 (×2): qty 90, 90d supply, fill #0
  Filled 2022-11-25: qty 90, 90d supply, fill #1
  Filled 2023-02-25: qty 90, 90d supply, fill #2
  Filled 2023-05-26: qty 90, 90d supply, fill #3

## 2022-06-06 MED ORDER — VITAMIN D (ERGOCALCIFEROL) 1.25 MG (50000 UNIT) PO CAPS
50000.0000 [IU] | ORAL_CAPSULE | ORAL | 5 refills | Status: AC
  Filled 2022-06-06: qty 12, 84d supply, fill #0

## 2022-06-06 MED ORDER — ATORVASTATIN CALCIUM 10 MG PO TABS
10.0000 mg | ORAL_TABLET | Freq: Every day | ORAL | 5 refills | Status: AC
  Filled 2022-06-06: qty 90, 90d supply, fill #0

## 2022-06-06 MED ORDER — JARDIANCE 25 MG PO TABS
25.0000 mg | ORAL_TABLET | Freq: Every day | ORAL | 5 refills | Status: AC
  Filled 2022-06-06 – 2022-12-31 (×2): qty 90, 90d supply, fill #0
  Filled 2023-03-10 – 2023-03-15 (×2): qty 90, 90d supply, fill #1

## 2022-06-06 MED ORDER — PANTOPRAZOLE SODIUM 40 MG PO TBEC
40.0000 mg | DELAYED_RELEASE_TABLET | Freq: Every day | ORAL | 5 refills | Status: AC
  Filled 2022-06-06: qty 90, 90d supply, fill #0

## 2022-06-06 MED ORDER — METFORMIN HCL 500 MG PO TABS
ORAL_TABLET | ORAL | 5 refills | Status: AC
  Filled 2022-06-06 – 2022-07-02 (×2): qty 270, 90d supply, fill #0
  Filled 2022-11-25: qty 270, 90d supply, fill #1
  Filled 2023-03-10: qty 270, 90d supply, fill #2

## 2022-06-21 ENCOUNTER — Other Ambulatory Visit (HOSPITAL_COMMUNITY): Payer: Self-pay

## 2022-06-27 DIAGNOSIS — E119 Type 2 diabetes mellitus without complications: Secondary | ICD-10-CM | POA: Diagnosis not present

## 2022-07-02 ENCOUNTER — Other Ambulatory Visit (HOSPITAL_COMMUNITY): Payer: Self-pay

## 2022-07-09 ENCOUNTER — Other Ambulatory Visit (HOSPITAL_COMMUNITY): Payer: Self-pay

## 2022-07-26 ENCOUNTER — Other Ambulatory Visit (HOSPITAL_COMMUNITY): Payer: Self-pay

## 2022-07-30 ENCOUNTER — Other Ambulatory Visit (HOSPITAL_COMMUNITY): Payer: Self-pay

## 2022-08-24 ENCOUNTER — Telehealth: Payer: Self-pay | Admitting: *Deleted

## 2022-08-24 NOTE — Telephone Encounter (Signed)
Patient returning call to schedule new patient appt. 

## 2022-08-27 NOTE — Telephone Encounter (Signed)
LMOM to let patient know that our office requires a physician referral.

## 2022-08-30 ENCOUNTER — Other Ambulatory Visit: Payer: Self-pay

## 2022-08-30 ENCOUNTER — Other Ambulatory Visit (HOSPITAL_COMMUNITY): Payer: Self-pay

## 2022-09-04 ENCOUNTER — Other Ambulatory Visit (HOSPITAL_COMMUNITY): Payer: Self-pay

## 2022-09-04 MED ORDER — NYSTATIN 100000 UNIT/GM EX POWD
1.0000 | Freq: Two times a day (BID) | CUTANEOUS | 0 refills | Status: DC | PRN
  Filled 2022-09-04: qty 15, 8d supply, fill #0

## 2022-09-05 ENCOUNTER — Other Ambulatory Visit (HOSPITAL_COMMUNITY): Payer: Self-pay

## 2022-09-05 ENCOUNTER — Other Ambulatory Visit: Payer: Self-pay

## 2022-09-05 MED ORDER — GLUCOSE BLOOD VI STRP
ORAL_STRIP | 2 refills | Status: AC
  Filled 2022-09-05: qty 100, 50d supply, fill #0
  Filled 2022-12-14: qty 100, 50d supply, fill #1
  Filled 2023-02-26: qty 100, 50d supply, fill #2

## 2022-09-05 MED ORDER — NYSTATIN 100000 UNIT/GM EX CREA
1.0000 | TOPICAL_CREAM | Freq: Two times a day (BID) | CUTANEOUS | 3 refills | Status: AC
  Filled 2022-09-05: qty 30, 30d supply, fill #0
  Filled 2022-10-31: qty 30, 30d supply, fill #1

## 2022-10-02 ENCOUNTER — Other Ambulatory Visit (HOSPITAL_COMMUNITY): Payer: Self-pay

## 2022-12-06 ENCOUNTER — Other Ambulatory Visit (HOSPITAL_COMMUNITY): Payer: Self-pay

## 2022-12-06 DIAGNOSIS — E782 Mixed hyperlipidemia: Secondary | ICD-10-CM | POA: Diagnosis not present

## 2022-12-06 DIAGNOSIS — K219 Gastro-esophageal reflux disease without esophagitis: Secondary | ICD-10-CM | POA: Diagnosis not present

## 2022-12-06 DIAGNOSIS — I10 Essential (primary) hypertension: Secondary | ICD-10-CM | POA: Diagnosis not present

## 2022-12-06 DIAGNOSIS — E1169 Type 2 diabetes mellitus with other specified complication: Secondary | ICD-10-CM | POA: Diagnosis not present

## 2022-12-06 MED ORDER — NYSTATIN-TRIAMCINOLONE 100000-0.1 UNIT/GM-% EX CREA
1.0000 | TOPICAL_CREAM | Freq: Two times a day (BID) | CUTANEOUS | 5 refills | Status: AC | PRN
  Filled 2022-12-06: qty 30, 15d supply, fill #0

## 2022-12-14 ENCOUNTER — Other Ambulatory Visit (HOSPITAL_COMMUNITY): Payer: Self-pay

## 2022-12-30 ENCOUNTER — Other Ambulatory Visit (HOSPITAL_COMMUNITY): Payer: Self-pay

## 2022-12-31 ENCOUNTER — Other Ambulatory Visit (HOSPITAL_COMMUNITY): Payer: Self-pay

## 2022-12-31 ENCOUNTER — Other Ambulatory Visit: Payer: Self-pay

## 2022-12-31 MED ORDER — JARDIANCE 25 MG PO TABS
25.0000 mg | ORAL_TABLET | Freq: Every day | ORAL | 3 refills | Status: DC
  Filled 2022-12-31 – 2023-07-01 (×2): qty 90, 90d supply, fill #0
  Filled 2023-09-29: qty 90, 90d supply, fill #1
  Filled 2023-12-30: qty 90, 90d supply, fill #2

## 2023-01-10 ENCOUNTER — Other Ambulatory Visit (HOSPITAL_COMMUNITY): Payer: Self-pay

## 2023-01-13 ENCOUNTER — Other Ambulatory Visit (HOSPITAL_COMMUNITY): Payer: Self-pay

## 2023-01-27 ENCOUNTER — Other Ambulatory Visit (HOSPITAL_COMMUNITY): Payer: Self-pay

## 2023-01-28 ENCOUNTER — Other Ambulatory Visit (HOSPITAL_COMMUNITY): Payer: Self-pay

## 2023-01-28 MED ORDER — ATORVASTATIN CALCIUM 10 MG PO TABS
10.0000 mg | ORAL_TABLET | Freq: Every day | ORAL | 4 refills | Status: DC
  Filled 2023-01-28: qty 90, 90d supply, fill #0
  Filled 2023-04-28: qty 90, 90d supply, fill #1
  Filled 2023-07-29: qty 90, 90d supply, fill #2
  Filled 2023-10-26: qty 90, 90d supply, fill #3
  Filled 2024-01-26: qty 90, 90d supply, fill #4

## 2023-02-25 DIAGNOSIS — Z1231 Encounter for screening mammogram for malignant neoplasm of breast: Secondary | ICD-10-CM | POA: Diagnosis not present

## 2023-02-26 ENCOUNTER — Other Ambulatory Visit (HOSPITAL_COMMUNITY): Payer: Self-pay

## 2023-02-27 ENCOUNTER — Encounter: Payer: Self-pay | Admitting: Nurse Practitioner

## 2023-03-10 ENCOUNTER — Other Ambulatory Visit (HOSPITAL_COMMUNITY): Payer: Self-pay

## 2023-03-11 ENCOUNTER — Other Ambulatory Visit: Payer: Self-pay

## 2023-03-12 ENCOUNTER — Other Ambulatory Visit (HOSPITAL_COMMUNITY): Payer: Self-pay

## 2023-04-15 ENCOUNTER — Other Ambulatory Visit (HOSPITAL_COMMUNITY): Payer: Self-pay

## 2023-04-15 MED ORDER — DOXYCYCLINE HYCLATE 100 MG PO TABS
100.0000 mg | ORAL_TABLET | Freq: Two times a day (BID) | ORAL | 0 refills | Status: AC
  Filled 2023-04-15: qty 20, 10d supply, fill #0

## 2023-04-29 ENCOUNTER — Other Ambulatory Visit (HOSPITAL_COMMUNITY): Payer: Self-pay

## 2023-05-21 ENCOUNTER — Encounter: Payer: Self-pay | Admitting: Nurse Practitioner

## 2023-05-21 ENCOUNTER — Ambulatory Visit (INDEPENDENT_AMBULATORY_CARE_PROVIDER_SITE_OTHER): Payer: Commercial Managed Care - PPO | Admitting: Nurse Practitioner

## 2023-05-21 VITALS — BP 122/76 | HR 87 | Ht 61.0 in | Wt 165.0 lb

## 2023-05-21 DIAGNOSIS — Z01419 Encounter for gynecological examination (general) (routine) without abnormal findings: Secondary | ICD-10-CM

## 2023-05-21 DIAGNOSIS — Z780 Asymptomatic menopausal state: Secondary | ICD-10-CM

## 2023-05-21 NOTE — Progress Notes (Signed)
   Alicia Gray 1966/02/24 [id number]   History:  58 y.o. G3P0030 presents for annual exam. No GYN complaints. Postmenopausal - no HRT. S/P TAH in 30s for fibroids. Normal pap history. HTN, DM.   Gynecologic History Patient's last menstrual period was 03/04/2001.   Contraception/Family planning: status post hysterectomy Sexually active: Yes  Health Maintenance Last Pap: 04/01/2019. Results were: Normal Last mammogram: 02/25/2023. Results were: Normal Last colonoscopy: 03/21/2021. Results were: Benign polyp, 10-year recall Last Dexa: Not indicated  Past medical history, past surgical history, family history and social history were all reviewed and documented in the EPIC chart. Married. 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   ROS:  A ROS was performed and pertinent positives and negatives are included.  Exam:  Vitals:   05/21/23 0747  BP: 122/76  Pulse: 87  SpO2: 96%  Weight: 165 lb (74.8 kg)  Height: 5\' 1"  (1.549 m)     Body mass index is 31.18 kg/m.  General appearance:  Normal Thyroid:  Symmetrical, normal in size, without palpable masses or nodularity. Respiratory  Auscultation:  Clear without wheezing or rhonchi Cardiovascular  Auscultation:  Regular rate, without rubs, murmurs or gallops  Edema/varicosities:  Not grossly evident Abdominal  Soft,nontender, without masses, guarding or rebound.  Liver/spleen:  No organomegaly noted  Hernia:  None appreciated  Skin  Inspection:  Grossly normal Breasts: Examined lying and sitting.   Right: Without masses, retractions, nipple discharge or axillary adenopathy.   Left: Without masses, retractions, nipple discharge or axillary adenopathy. Pelvic: External genitalia:  no lesions              Urethra:  normal appearing urethra with no masses, tenderness or lesions              Bartholins and Skenes: normal                 Vagina: normal appearing vagina with normal color and discharge, no lesions               Cervix: absent Bimanual Exam:  Uterus:  absent              Adnexa: no mass, fullness, tenderness              Rectovaginal: Deferred              Anus:  normal, no lesions  Patient informed chaperone available to be present for breast and pelvic exam. Patient has requested no chaperone to be present. Patient has been advised what will be completed during breast and pelvic exam.   Assessment/Plan:  58 y.o. G3P0030 for annual exam.   Well female exam with routine gynecological exam - Education provided on SBEs, importance of preventative screenings, current guidelines, high calcium diet, regular exercise, and multivitamin daily. Labs with PCP.   Postmenopausal - no HRT. S/P TAH in her 30s for fibroids.   Screening for cervical cancer - Normal Pap history. No longer screening per guidelines.  Screening for breast cancer - Normal mammogram history.  Continue annual screenings.  Normal breast exam today.  Screening for colon cancer - 03/2021 colonoscopy. Will repeat at 10-year interval per GI's recommendation.   Screening for osteoporosis - Average risk. Will plan DXA at age 58.   Return in about 1 year (around 05/20/2024) for Annual.    Alicia Mackie DNP, 7:56 AM 05/21/2023

## 2023-05-27 ENCOUNTER — Other Ambulatory Visit: Payer: Self-pay

## 2023-05-27 ENCOUNTER — Other Ambulatory Visit (HOSPITAL_COMMUNITY): Payer: Self-pay

## 2023-05-31 ENCOUNTER — Other Ambulatory Visit (HOSPITAL_COMMUNITY): Payer: Self-pay

## 2023-06-03 ENCOUNTER — Other Ambulatory Visit (HOSPITAL_COMMUNITY): Payer: Self-pay

## 2023-06-03 MED ORDER — FREESTYLE TEST VI STRP
ORAL_STRIP | 3 refills | Status: AC
  Filled 2023-06-03: qty 100, 90d supply, fill #0
  Filled 2023-09-06: qty 100, 90d supply, fill #1
  Filled 2024-01-29: qty 100, 90d supply, fill #2
  Filled 2024-05-09: qty 100, 90d supply, fill #3

## 2023-06-07 ENCOUNTER — Other Ambulatory Visit (HOSPITAL_COMMUNITY): Payer: Self-pay

## 2023-06-12 ENCOUNTER — Other Ambulatory Visit (HOSPITAL_COMMUNITY): Payer: Self-pay

## 2023-06-12 DIAGNOSIS — Z1389 Encounter for screening for other disorder: Secondary | ICD-10-CM | POA: Diagnosis not present

## 2023-06-12 DIAGNOSIS — E1169 Type 2 diabetes mellitus with other specified complication: Secondary | ICD-10-CM | POA: Diagnosis not present

## 2023-06-12 DIAGNOSIS — M519 Unspecified thoracic, thoracolumbar and lumbosacral intervertebral disc disorder: Secondary | ICD-10-CM | POA: Diagnosis not present

## 2023-06-12 DIAGNOSIS — E559 Vitamin D deficiency, unspecified: Secondary | ICD-10-CM | POA: Diagnosis not present

## 2023-06-12 DIAGNOSIS — I10 Essential (primary) hypertension: Secondary | ICD-10-CM | POA: Diagnosis not present

## 2023-06-12 DIAGNOSIS — E782 Mixed hyperlipidemia: Secondary | ICD-10-CM | POA: Diagnosis not present

## 2023-06-12 DIAGNOSIS — J309 Allergic rhinitis, unspecified: Secondary | ICD-10-CM | POA: Diagnosis not present

## 2023-06-12 DIAGNOSIS — Z0000 Encounter for general adult medical examination without abnormal findings: Secondary | ICD-10-CM | POA: Diagnosis not present

## 2023-06-12 MED ORDER — METFORMIN HCL 500 MG PO TABS
1000.0000 mg | ORAL_TABLET | Freq: Two times a day (BID) | ORAL | 5 refills | Status: AC
Start: 2023-06-12 — End: ?
  Filled 2023-06-12: qty 270, 90d supply, fill #0

## 2023-06-12 MED ORDER — VITAMIN D (ERGOCALCIFEROL) 1.25 MG (50000 UNIT) PO CAPS
50000.0000 [IU] | ORAL_CAPSULE | ORAL | 5 refills | Status: AC
  Filled 2023-06-12: qty 12, 84d supply, fill #0
  Filled 2024-02-13: qty 12, 84d supply, fill #1
  Filled 2024-05-09: qty 12, 84d supply, fill #2

## 2023-06-12 MED ORDER — LOSARTAN POTASSIUM 25 MG PO TABS
25.0000 mg | ORAL_TABLET | Freq: Every day | ORAL | 5 refills | Status: AC
  Filled 2023-06-12 – 2023-08-09 (×2): qty 90, 90d supply, fill #0
  Filled 2023-11-24: qty 90, 90d supply, fill #1
  Filled 2024-02-13: qty 90, 90d supply, fill #2

## 2023-06-30 ENCOUNTER — Other Ambulatory Visit (HOSPITAL_COMMUNITY): Payer: Self-pay

## 2023-07-01 ENCOUNTER — Other Ambulatory Visit: Payer: Self-pay

## 2023-07-01 ENCOUNTER — Other Ambulatory Visit (HOSPITAL_COMMUNITY): Payer: Self-pay

## 2023-07-01 MED ORDER — JARDIANCE 25 MG PO TABS
25.0000 mg | ORAL_TABLET | Freq: Every day | ORAL | 5 refills | Status: AC
  Filled 2023-07-01: qty 90, 90d supply, fill #0

## 2023-07-03 ENCOUNTER — Other Ambulatory Visit (HOSPITAL_COMMUNITY): Payer: Self-pay

## 2023-07-03 MED ORDER — JARDIANCE 25 MG PO TABS
25.0000 mg | ORAL_TABLET | Freq: Every day | ORAL | 5 refills | Status: AC
  Filled 2023-07-03 – 2023-10-26 (×2): qty 90, 90d supply, fill #0

## 2023-07-04 DIAGNOSIS — E119 Type 2 diabetes mellitus without complications: Secondary | ICD-10-CM | POA: Diagnosis not present

## 2023-08-08 ENCOUNTER — Other Ambulatory Visit (HOSPITAL_COMMUNITY): Payer: Self-pay

## 2023-08-09 ENCOUNTER — Other Ambulatory Visit (HOSPITAL_COMMUNITY): Payer: Self-pay

## 2023-08-12 ENCOUNTER — Other Ambulatory Visit (HOSPITAL_COMMUNITY): Payer: Self-pay

## 2023-08-13 ENCOUNTER — Other Ambulatory Visit (HOSPITAL_COMMUNITY): Payer: Self-pay

## 2023-08-13 MED ORDER — CLINDAMYCIN HCL 150 MG PO CAPS
150.0000 mg | ORAL_CAPSULE | Freq: Three times a day (TID) | ORAL | 0 refills | Status: AC
Start: 2023-08-13 — End: ?
  Filled 2023-08-13: qty 21, 7d supply, fill #0

## 2023-08-13 MED ORDER — IBUPROFEN 600 MG PO TABS
600.0000 mg | ORAL_TABLET | ORAL | 0 refills | Status: AC
  Filled 2023-08-13: qty 14, 3d supply, fill #0

## 2023-08-16 ENCOUNTER — Other Ambulatory Visit (HOSPITAL_COMMUNITY): Payer: Self-pay

## 2023-09-06 ENCOUNTER — Other Ambulatory Visit (HOSPITAL_COMMUNITY): Payer: Self-pay

## 2023-10-28 ENCOUNTER — Other Ambulatory Visit (HOSPITAL_COMMUNITY): Payer: Self-pay

## 2023-10-31 ENCOUNTER — Other Ambulatory Visit (HOSPITAL_COMMUNITY): Payer: Self-pay

## 2023-12-10 ENCOUNTER — Other Ambulatory Visit (HOSPITAL_COMMUNITY): Payer: Self-pay

## 2023-12-10 DIAGNOSIS — E559 Vitamin D deficiency, unspecified: Secondary | ICD-10-CM | POA: Diagnosis not present

## 2023-12-10 DIAGNOSIS — I10 Essential (primary) hypertension: Secondary | ICD-10-CM | POA: Diagnosis not present

## 2023-12-10 DIAGNOSIS — M519 Unspecified thoracic, thoracolumbar and lumbosacral intervertebral disc disorder: Secondary | ICD-10-CM | POA: Diagnosis not present

## 2023-12-10 DIAGNOSIS — E782 Mixed hyperlipidemia: Secondary | ICD-10-CM | POA: Diagnosis not present

## 2023-12-10 DIAGNOSIS — J309 Allergic rhinitis, unspecified: Secondary | ICD-10-CM | POA: Diagnosis not present

## 2023-12-10 DIAGNOSIS — E1169 Type 2 diabetes mellitus with other specified complication: Secondary | ICD-10-CM | POA: Diagnosis not present

## 2023-12-10 MED ORDER — METFORMIN HCL 500 MG PO TABS
ORAL_TABLET | ORAL | 5 refills | Status: AC
  Filled 2023-12-10: qty 270, 90d supply, fill #0
  Filled 2024-05-09: qty 270, 90d supply, fill #1

## 2023-12-24 ENCOUNTER — Other Ambulatory Visit (HOSPITAL_COMMUNITY): Payer: Self-pay

## 2023-12-24 MED ORDER — FLUZONE 0.5 ML IM SUSY
0.5000 mL | PREFILLED_SYRINGE | Freq: Once | INTRAMUSCULAR | 0 refills | Status: AC
Start: 2023-12-24 — End: 2023-12-25
  Filled 2023-12-24: qty 0.5, 1d supply, fill #0

## 2024-01-29 ENCOUNTER — Other Ambulatory Visit (HOSPITAL_COMMUNITY): Payer: Self-pay

## 2024-01-30 ENCOUNTER — Other Ambulatory Visit (HOSPITAL_COMMUNITY): Payer: Self-pay

## 2024-03-02 DIAGNOSIS — Z1231 Encounter for screening mammogram for malignant neoplasm of breast: Secondary | ICD-10-CM | POA: Diagnosis not present

## 2024-03-22 ENCOUNTER — Other Ambulatory Visit (HOSPITAL_COMMUNITY): Payer: Self-pay

## 2024-03-23 ENCOUNTER — Other Ambulatory Visit (HOSPITAL_COMMUNITY): Payer: Self-pay

## 2024-03-23 MED ORDER — JARDIANCE 25 MG PO TABS
25.0000 mg | ORAL_TABLET | Freq: Every day | ORAL | 5 refills | Status: AC
  Filled 2024-03-23: qty 90, 90d supply, fill #0

## 2024-03-24 ENCOUNTER — Other Ambulatory Visit (HOSPITAL_COMMUNITY): Payer: Self-pay

## 2024-04-27 ENCOUNTER — Other Ambulatory Visit (HOSPITAL_COMMUNITY): Payer: Self-pay

## 2024-04-28 ENCOUNTER — Other Ambulatory Visit (HOSPITAL_COMMUNITY): Payer: Self-pay

## 2024-04-28 MED ORDER — ATORVASTATIN CALCIUM 10 MG PO TABS
10.0000 mg | ORAL_TABLET | Freq: Every day | ORAL | 1 refills | Status: AC
  Filled 2024-04-28 (×2): qty 90, 90d supply, fill #0

## 2024-05-15 ENCOUNTER — Other Ambulatory Visit: Payer: Self-pay

## 2024-05-21 ENCOUNTER — Ambulatory Visit: Payer: Commercial Managed Care - PPO | Admitting: Nurse Practitioner
